# Patient Record
Sex: Female | Born: 1955 | Race: White | Hispanic: No | Marital: Married | Smoking: Never smoker
Health system: Southern US, Community
[De-identification: ages and names within clinical notes are randomized; demographics above are authoritative.]

## PROBLEM LIST (undated history)

## (undated) DIAGNOSIS — K8020 Calculus of gallbladder without cholecystitis without obstruction: Secondary | ICD-10-CM

## (undated) DIAGNOSIS — K602 Anal fissure, unspecified: Secondary | ICD-10-CM

## (undated) DIAGNOSIS — K449 Diaphragmatic hernia without obstruction or gangrene: Secondary | ICD-10-CM

## (undated) DIAGNOSIS — K635 Polyp of colon: Secondary | ICD-10-CM

## (undated) HISTORY — PX: SHOULDER SURGERY: SHX246

## (undated) HISTORY — DX: Anal fissure, unspecified: K60.2

## (undated) HISTORY — DX: Polyp of colon: K63.5

## (undated) HISTORY — DX: Diaphragmatic hernia without obstruction or gangrene: K44.9

## (undated) HISTORY — DX: Calculus of gallbladder without cholecystitis without obstruction: K80.20

## (undated) HISTORY — PX: GALLBLADDER SURGERY: SHX652

---

## 1998-05-30 ENCOUNTER — Other Ambulatory Visit: Admission: RE | Admit: 1998-05-30 | Discharge: 1998-05-30 | Payer: Self-pay | Admitting: Obstetrics and Gynecology

## 1999-06-18 ENCOUNTER — Other Ambulatory Visit: Admission: RE | Admit: 1999-06-18 | Discharge: 1999-06-18 | Payer: Self-pay | Admitting: Obstetrics and Gynecology

## 2000-07-05 ENCOUNTER — Other Ambulatory Visit: Admission: RE | Admit: 2000-07-05 | Discharge: 2000-07-05 | Payer: Self-pay | Admitting: Obstetrics and Gynecology

## 2001-07-07 ENCOUNTER — Other Ambulatory Visit: Admission: RE | Admit: 2001-07-07 | Discharge: 2001-07-07 | Payer: Self-pay | Admitting: Obstetrics and Gynecology

## 2002-07-12 ENCOUNTER — Other Ambulatory Visit: Admission: RE | Admit: 2002-07-12 | Discharge: 2002-07-12 | Payer: Self-pay | Admitting: Obstetrics and Gynecology

## 2003-08-08 ENCOUNTER — Other Ambulatory Visit: Admission: RE | Admit: 2003-08-08 | Discharge: 2003-08-08 | Payer: Self-pay | Admitting: Obstetrics and Gynecology

## 2003-08-12 ENCOUNTER — Encounter: Admission: RE | Admit: 2003-08-12 | Discharge: 2003-08-12 | Payer: Self-pay | Admitting: Obstetrics and Gynecology

## 2004-07-08 ENCOUNTER — Other Ambulatory Visit: Admission: RE | Admit: 2004-07-08 | Discharge: 2004-07-08 | Payer: Self-pay | Admitting: Obstetrics and Gynecology

## 2007-03-22 ENCOUNTER — Ambulatory Visit (HOSPITAL_BASED_OUTPATIENT_CLINIC_OR_DEPARTMENT_OTHER): Admission: RE | Admit: 2007-03-22 | Discharge: 2007-03-22 | Payer: Self-pay | Admitting: Orthopedic Surgery

## 2008-12-24 ENCOUNTER — Ambulatory Visit: Payer: Self-pay | Admitting: Sports Medicine

## 2008-12-24 DIAGNOSIS — M24876 Other specific joint derangements of unspecified foot, not elsewhere classified: Secondary | ICD-10-CM

## 2008-12-24 DIAGNOSIS — R269 Unspecified abnormalities of gait and mobility: Secondary | ICD-10-CM | POA: Insufficient documentation

## 2008-12-24 DIAGNOSIS — M2140 Flat foot [pes planus] (acquired), unspecified foot: Secondary | ICD-10-CM | POA: Insufficient documentation

## 2008-12-24 DIAGNOSIS — M24873 Other specific joint derangements of unspecified ankle, not elsewhere classified: Secondary | ICD-10-CM | POA: Insufficient documentation

## 2009-03-04 ENCOUNTER — Ambulatory Visit (HOSPITAL_COMMUNITY): Admission: RE | Admit: 2009-03-04 | Discharge: 2009-03-05 | Payer: Self-pay | Admitting: General Surgery

## 2009-03-04 ENCOUNTER — Encounter (INDEPENDENT_AMBULATORY_CARE_PROVIDER_SITE_OTHER): Payer: Self-pay | Admitting: General Surgery

## 2009-04-03 ENCOUNTER — Encounter: Admission: RE | Admit: 2009-04-03 | Discharge: 2009-04-03 | Payer: Self-pay | Admitting: Obstetrics and Gynecology

## 2010-01-08 ENCOUNTER — Emergency Department (HOSPITAL_COMMUNITY): Admission: EM | Admit: 2010-01-08 | Discharge: 2009-02-10 | Payer: Self-pay | Admitting: Emergency Medicine

## 2010-02-22 ENCOUNTER — Encounter: Payer: Self-pay | Admitting: Obstetrics and Gynecology

## 2010-02-24 ENCOUNTER — Other Ambulatory Visit: Payer: Self-pay | Admitting: Obstetrics and Gynecology

## 2010-02-24 DIAGNOSIS — Z1239 Encounter for other screening for malignant neoplasm of breast: Secondary | ICD-10-CM

## 2010-02-27 ENCOUNTER — Other Ambulatory Visit: Payer: Self-pay | Admitting: Internal Medicine

## 2010-02-27 DIAGNOSIS — E28319 Asymptomatic premature menopause: Secondary | ICD-10-CM

## 2010-03-03 NOTE — Assessment & Plan Note (Signed)
Summary: NP ANKLE INSTABILITY/MJD   Vital Signs:  Patient profile:   55 year old female Height:      67 inches Weight:      170 pounds BMI:     26.72 BP sitting:   111 / 71  Vitals Entered By: Lillia Pauls CMA (December 24, 2008 1:41 PM)  History of Present Illness: 55 yo F presents with bilateral ankle instability.  Patient reports prior history of recurrent ankle sprains, most recently 2 years ago.  Likes to hike and they feel unstable when she is stepping on rocks or on uneven terrain.  Has not done physical therapy, does not have ASOs.  Has new hiking boots that give her ankles better support.  No pain currently but this comes and goes.  Has intermittent numbness on top of right foot and pain lateral left ankle.  Physical Exam  General:  Well-developed,well-nourished,in no acute distress; alert,appropriate and cooperative throughout examination Msk:  L Ankle: No visible erythema.  Mild swelling sinus tarsi. Range of motion is full in all directions. Strength is 5/5 in all directions. 2+ ant drawer, 1+ talar tilt, neg reverse talar tilt. Talar dome nontender; No pain at base of 5th MT; No tenderness over cuboid; No tenderness over N spot or navicular prominence No tenderness on posterior aspects of lateral and medial malleolus No sign of peroneal tendon subluxations; Negative tarsal tunnel tinel's Able to walk 4 steps.   R Ankle: No visible erythema.  Minimal sinus tarsi swelling. Range of motion is full in all directions. Strength is 5/5 in all directions. 2+ anterior drawer, 1+ talar tilt, neg reverse talar tilt Talar dome nontender; No pain at base of 5th MT; No tenderness over cuboid; No tenderness over N spot or navicular prominence No tenderness on posterior aspects of lateral and medial malleolus No sign of peroneal tendon subluxations; Negative tarsal tunnel tinel's Able to walk 4 steps.  Pes planus on left, less overpronation on right but still  present Outturns bilateral feet with ambulation - improved with athletic insoles with scaphoid pads Less PT firing on left on toe raises. No leg length inequality   Impression & Recommendations:  Problem # 1:  ANKLE INSTABILITY (EAV-409.81) Assessment New  Ankle exercises given with theraband.  To do balance exercises as well - handout provided.  Consider ASO bilaterally if still has instability with exercises and correction of foot breakdown/gait abnormality.  Orders: Sports Insoles [phone])  Problem # 2:  ABNORMALITY OF GAIT (ICD-781.2) Assessment: New Improved with sports insoles with scaphoid pads.  Orders: Sports Insoles [phone])  Problem # 3:  PES PLANUS (ICD-734) Assessment: New Scaphoid pads bilaterally.  Orders: Sports Insoles [phone])  Patient Instructions: 1)  Wear inserts with scaphoid pads in your shoes - you can move these from shoe to shoe. 2)  Do balance exercises on handout and theraband strengthening exercises. 3)  Follow up with [place] in 1 month for reevaluation.

## 2010-03-16 ENCOUNTER — Ambulatory Visit
Admission: RE | Admit: 2010-03-16 | Discharge: 2010-03-16 | Disposition: A | Discharge status: Home / Self Care | Payer: BC Managed Care – PPO | Source: Ambulatory Visit | Attending: Internal Medicine | Admitting: Internal Medicine

## 2010-03-16 ENCOUNTER — Other Ambulatory Visit: Payer: Self-pay | Admitting: Internal Medicine

## 2010-03-16 DIAGNOSIS — E28319 Asymptomatic premature menopause: Secondary | ICD-10-CM

## 2010-04-07 ENCOUNTER — Ambulatory Visit: Payer: Self-pay

## 2010-04-19 LAB — CBC
HCT: 38.8 % (ref 36.0–46.0)
HCT: 40.2 % (ref 36.0–46.0)
Hemoglobin: 13.5 g/dL (ref 12.0–15.0)
Hemoglobin: 13.6 g/dL (ref 12.0–15.0)
MCHC: 34.7 g/dL (ref 30.0–36.0)
MCHC: 33.7 g/dL (ref 30.0–36.0)
MCV: 90.3 fL (ref 78.0–100.0)
MCV: 89.9 fL (ref 78.0–100.0)
Platelets: 221 10*3/uL (ref 150–400)
Platelets: 275 10*3/uL (ref 150–400)
RBC: 4.3 MIL/uL (ref 3.87–5.11)
RBC: 4.48 MIL/uL (ref 3.87–5.11)
RDW: 13.1 % (ref 11.5–15.5)
RDW: 12.8 % (ref 11.5–15.5)
WBC: 8.5 10*3/uL (ref 4.0–10.5)
WBC: 4.5 10*3/uL (ref 4.0–10.5)

## 2010-04-19 LAB — COMPREHENSIVE METABOLIC PANEL
ALT: 20 U/L (ref 0–35)
ALT: 17 U/L (ref 0–35)
AST: 30 U/L (ref 0–37)
AST: 23 U/L (ref 0–37)
Albumin: 4.2 g/dL (ref 3.5–5.2)
Albumin: 4.2 g/dL (ref 3.5–5.2)
Alkaline Phosphatase: 109 U/L (ref 39–117)
Alkaline Phosphatase: 116 U/L (ref 39–117)
BUN: 13 mg/dL (ref 6–23)
BUN: 7 mg/dL (ref 6–23)
CO2: 21 mEq/L (ref 19–32)
CO2: 28 mEq/L (ref 19–32)
Calcium: 9.6 mg/dL (ref 8.4–10.5)
Calcium: 9.5 mg/dL (ref 8.4–10.5)
Chloride: 102 mEq/L (ref 96–112)
Chloride: 105 mEq/L (ref 96–112)
Creatinine, Ser: 0.82 mg/dL (ref 0.4–1.2)
Creatinine, Ser: 0.82 mg/dL (ref 0.4–1.2)
GFR calc Af Amer: >60 mL/min (ref >60)
GFR calc Af Amer: >60 mL/min (ref >60)
GFR calc non Af Amer: >60 mL/min (ref >60)
GFR calc non Af Amer: >60 mL/min (ref >60)
Glucose, Bld: 162 mg/dL — ABNORMAL HIGH (ref 70–99)
Glucose, Bld: 105 mg/dL — ABNORMAL HIGH (ref 70–99)
Potassium: 3.4 mEq/L — ABNORMAL LOW (ref 3.5–5.1)
Potassium: 4.3 mEq/L (ref 3.5–5.1)
Sodium: 138 mEq/L (ref 135–145)
Sodium: 140 mEq/L (ref 135–145)
Total Bilirubin: 0.4 mg/dL (ref 0.3–1.2)
Total Bilirubin: 0.3 mg/dL (ref 0.3–1.2)
Total Protein: 7.5 g/dL (ref 6.0–8.3)
Total Protein: 6.9 g/dL (ref 6.0–8.3)

## 2010-04-19 LAB — DIFFERENTIAL
Basophils Absolute: 0 10*3/uL (ref 0.0–0.1)
Basophils Absolute: 0 10*3/uL (ref 0.0–0.1)
Basophils Relative: 1 % (ref 0–1)
Basophils Relative: 1 % (ref 0–1)
Eosinophils Absolute: 0.1 10*3/uL (ref 0.0–0.7)
Eosinophils Absolute: 0.1 10*3/uL (ref 0.0–0.7)
Eosinophils Relative: 1 % (ref 0–5)
Eosinophils Relative: 2 % (ref 0–5)
Lymphocytes Relative: 17 % (ref 12–46)
Lymphocytes Relative: 38 % (ref 12–46)
Lymphs Abs: 1.4 10*3/uL (ref 0.7–4.0)
Lymphs Abs: 1.7 10*3/uL (ref 0.7–4.0)
Monocytes Absolute: 0.7 10*3/uL (ref 0.1–1.0)
Monocytes Absolute: 0.4 10*3/uL (ref 0.1–1.0)
Monocytes Relative: 8 % (ref 3–12)
Monocytes Relative: 9 % (ref 3–12)
Neutro Abs: 6.3 10*3/uL (ref 1.7–7.7)
Neutro Abs: 2.3 10*3/uL (ref 1.7–7.7)
Neutrophils Relative %: 74 % (ref 43–77)
Neutrophils Relative %: 50 % (ref 43–77)

## 2010-04-19 LAB — URINALYSIS, ROUTINE W REFLEX MICROSCOPIC
Bilirubin Urine: NEGATIVE
Glucose, UA: NEGATIVE mg/dL
Hgb urine dipstick: NEGATIVE
Ketones, ur: >80 mg/dL — AB
Nitrite: NEGATIVE
Protein, ur: NEGATIVE mg/dL
Specific Gravity, Urine: 1.024 (ref 1.005–1.030)
Urobilinogen, UA: 0.2 mg/dL (ref 0.0–1.0)
pH: 8.5 — ABNORMAL HIGH (ref 5.0–8.0)

## 2010-04-19 LAB — POCT CARDIAC MARKERS
CKMB, poc: <1 ng/mL — ABNORMAL LOW (ref 1.0–8.0)
Myoglobin, poc: 52.4 ng/mL (ref 12–200)
Troponin i, poc: <0.05 ng/mL (ref 0.00–0.09)

## 2010-04-19 LAB — LIPASE, BLOOD: Lipase: 32 U/L (ref 11–59)

## 2010-04-19 LAB — LACTIC ACID, PLASMA: Lactic Acid, Venous: 4.8 mmol/L — ABNORMAL HIGH (ref 0.5–2.2)

## 2010-04-21 ENCOUNTER — Ambulatory Visit
Admission: RE | Admit: 2010-04-21 | Discharge: 2010-04-21 | Disposition: A | Discharge status: Home / Self Care | Payer: BC Managed Care – PPO | Source: Ambulatory Visit | Attending: Obstetrics and Gynecology | Admitting: Obstetrics and Gynecology

## 2010-04-21 DIAGNOSIS — Z1239 Encounter for other screening for malignant neoplasm of breast: Secondary | ICD-10-CM

## 2010-04-22 LAB — CARDIAC PANEL(CRET KIN+CKTOT+MB+TROPI)
CK, MB: 0.7 ng/mL (ref 0.3–4.0)
CK, MB: 0.8 ng/mL (ref 0.3–4.0)
Relative Index: INVALID (ref 0.0–2.5)
Relative Index: INVALID (ref 0.0–2.5)
Total CK: 41 U/L (ref 7–177)
Total CK: 43 U/L (ref 7–177)
Troponin I: 0.01 ng/mL (ref 0.00–0.06)
Troponin I: 0.01 ng/mL (ref 0.00–0.06)

## 2010-06-16 NOTE — Op Note (Signed)
NAME:  Lacey Richardson, CHISM NO.:  [id number]   MEDICAL RECORD NO.:  [id number]          PATIENT TYPE:  AMB   LOCATION:  DSC                          FACILITY:  [facility]   PHYSICIAN:  Mila Homer. Sherlean Foot, M.D. DATE OF BIRTH:  Jul 22, 1955   DATE OF PROCEDURE:  03/22/2007  DATE OF DISCHARGE:                               OPERATIVE REPORT   SURGEON:  Mila Homer. Sherlean Foot, M.D.   ASSISTANTArlys John D. Petrarca, P.A.-C.   ANESTHESIA:  General.   PREOPERATIVE DIAGNOSIS:  Right shoulder labral tear.   POSTOPERATIVE DIAGNOSIS:  Right shoulder labral tear.   PROCEDURE:  Right shoulder arthroscopy and labral repair.   INDICATIONS FOR PROCEDURE:  The patient is 55 years old status post  subluxation episode now with arthrofibrosis of the shoulder and evidence  of a labral tear.  Informed consent was obtained.   DESCRIPTION OF PROCEDURE:  The patient was laid supine and administered  general anesthesia.  The shoulder was prepped and draped in the usual  sterile fashion with the patient in a beach chair position.  Anterior  posterior portals were created with a #11 blade, blunt trocar and  cannula, diagnostic arthroscopy revealed no chondromalacia in the joint,  a labral tear anteriorly, appeared to have healed proximally at the  biceps to about the 3 o'clock position, lots of inflammation in the  shoulder, the inflammation was debrided. This was from the manipulation  obviously.  I then evaluated the labrum and felt that a single anchor  repair would be adequate.  I then used a 4 mm cylindrical bur to bur the  anterior glenoid and then used the 3.5 push lock system to place a  single push lock repairing the labrum.  This provided a really nice  repair of the labrum and there was a negative drive through sign. The  rest of the shoulder looked fine except for the inflammation. Closed  with 4-0 nylon sutures, dressed with Xeroform dressing, sponges, ABDs  and 2 inch silk tape, and a sling  and swath.  Complications none.  Drains none.           ______________________________  Mila Homer. Sherlean Foot, M.D.     SDL/MEDQ  D:  03/22/2007  T:  03/22/2007  Job:  [id number]

## 2010-09-01 ENCOUNTER — Other Ambulatory Visit: Payer: Self-pay | Admitting: Family Medicine

## 2010-09-01 ENCOUNTER — Other Ambulatory Visit (HOSPITAL_COMMUNITY)
Admission: RE | Admit: 2010-09-01 | Discharge: 2010-09-01 | Disposition: A | Discharge status: Home / Self Care | Payer: BC Managed Care – PPO | Source: Ambulatory Visit | Attending: Family Medicine | Admitting: Family Medicine

## 2010-09-01 DIAGNOSIS — Z1159 Encounter for screening for other viral diseases: Secondary | ICD-10-CM | POA: Insufficient documentation

## 2010-09-01 DIAGNOSIS — Z124 Encounter for screening for malignant neoplasm of cervix: Secondary | ICD-10-CM | POA: Insufficient documentation

## 2010-10-23 LAB — POCT HEMOGLOBIN-HEMACUE: Hemoglobin: 13.7

## 2011-03-16 ENCOUNTER — Other Ambulatory Visit: Payer: Self-pay | Admitting: Family Medicine

## 2011-03-16 DIAGNOSIS — Z1231 Encounter for screening mammogram for malignant neoplasm of breast: Secondary | ICD-10-CM

## 2011-04-22 ENCOUNTER — Ambulatory Visit: Payer: BC Managed Care – PPO

## 2011-05-21 ENCOUNTER — Ambulatory Visit
Admission: RE | Admit: 2011-05-21 | Discharge: 2011-05-21 | Disposition: A | Discharge status: Home / Self Care | Payer: BC Managed Care – PPO | Source: Ambulatory Visit | Attending: Family Medicine | Admitting: Family Medicine

## 2011-05-21 DIAGNOSIS — Z1231 Encounter for screening mammogram for malignant neoplasm of breast: Secondary | ICD-10-CM

## 2011-06-09 IMAGING — US US ABDOMEN COMPLETE
1 series · 14 of 25 positions shown · non-contrast
Comparison: None

CLINICAL DATA: Epigastric pain.

COMPLETE ABDOMINAL ULTRASOUND

[Series 1: us abdomen complete · 0.33mm/px · 14 of 75 slices shown]
[im 1/75]
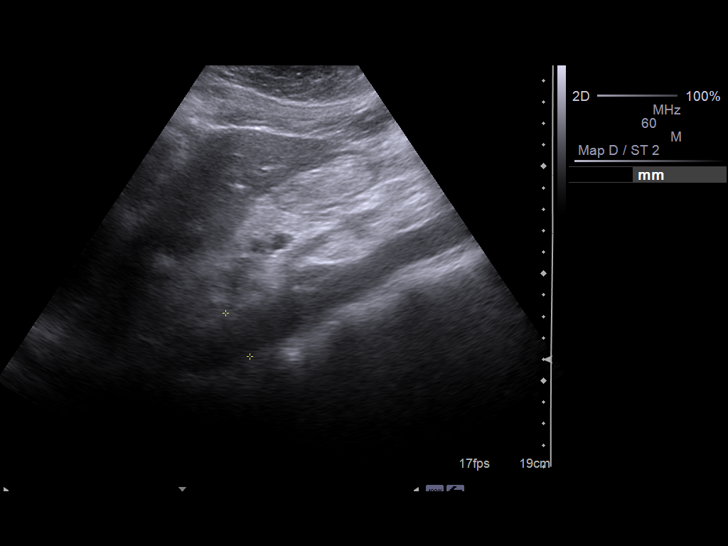
[im 7/75]
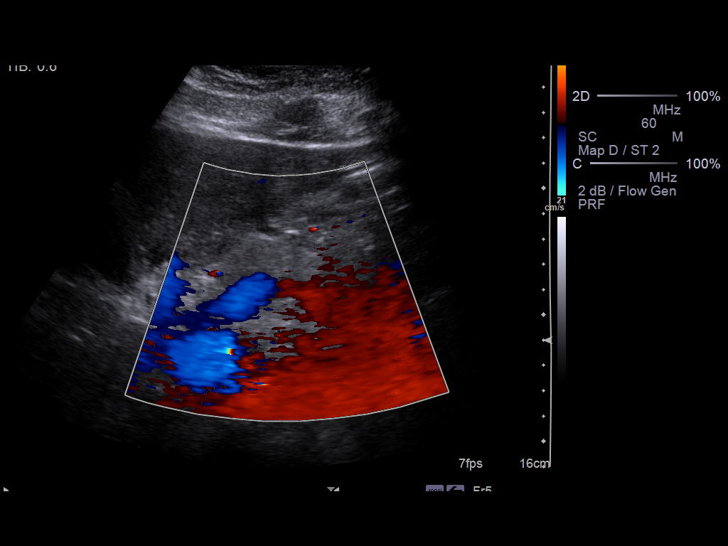
[im 13/75]
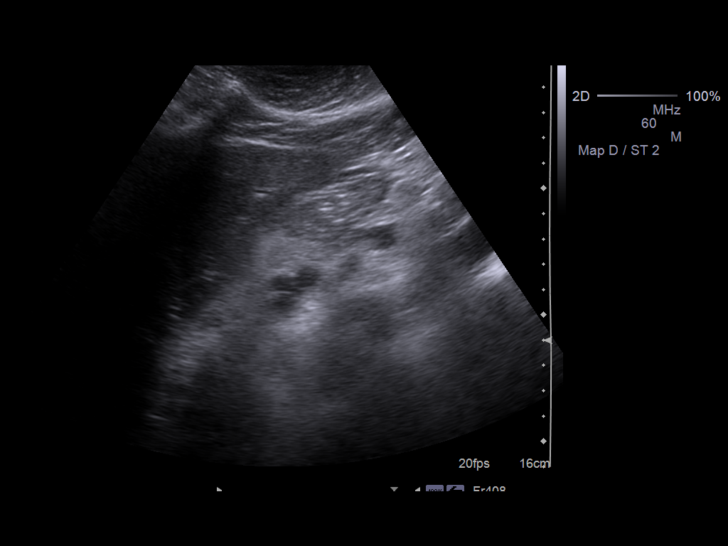
[im 19/75]
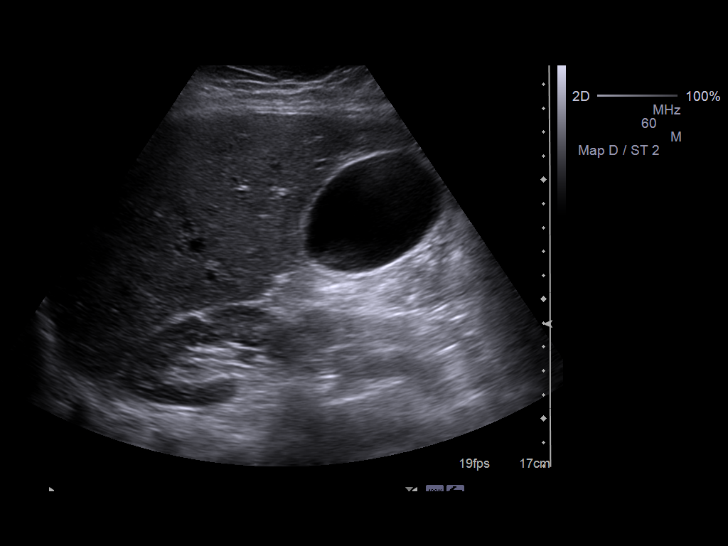
[im 25/75]
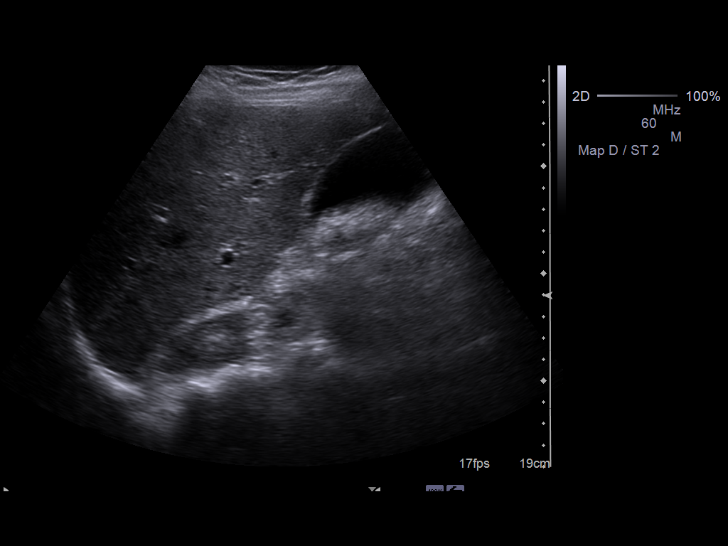
[im 28/75]
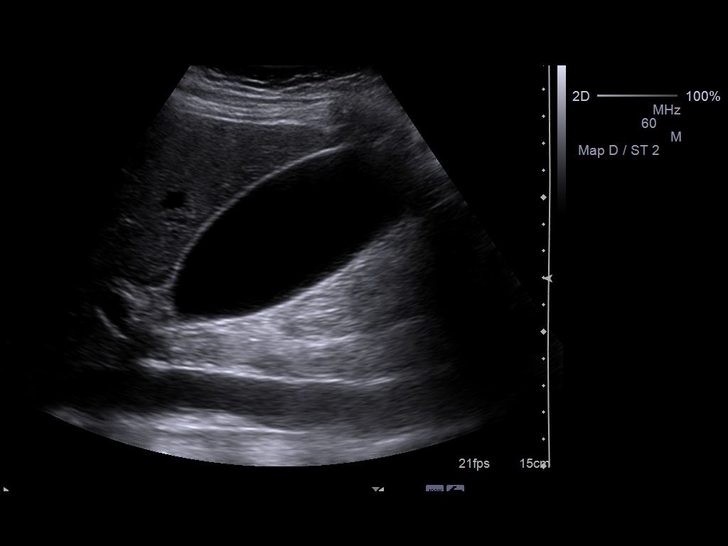
[im 34/75]
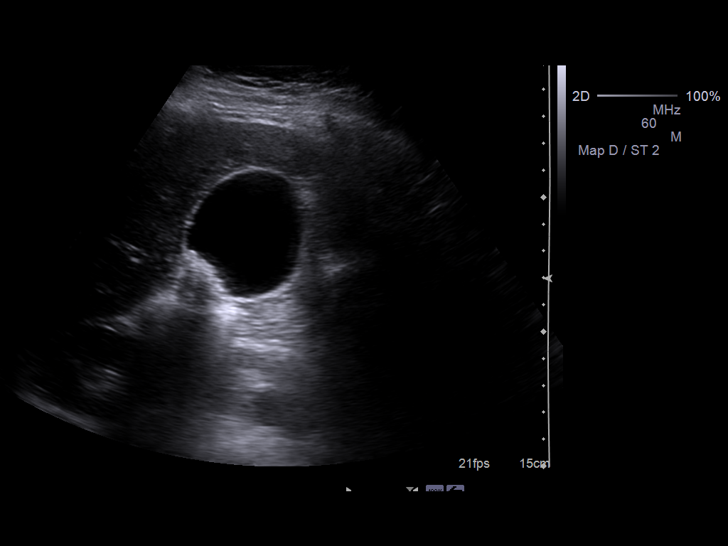
[im 41/75]
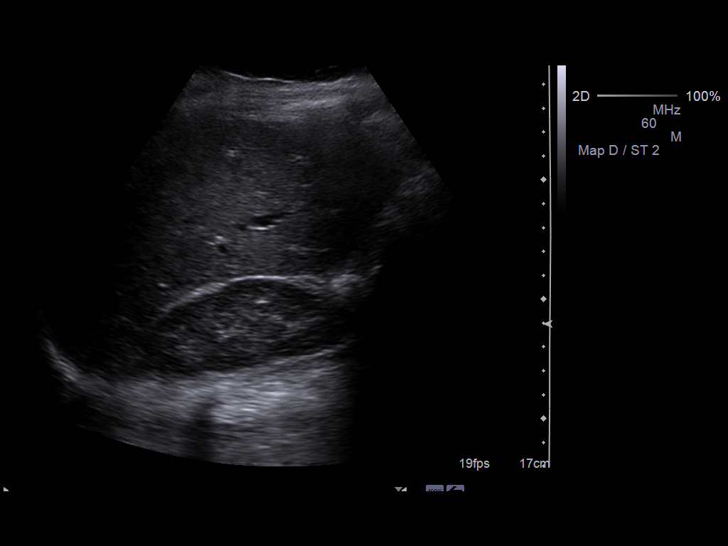
[im 47/75]
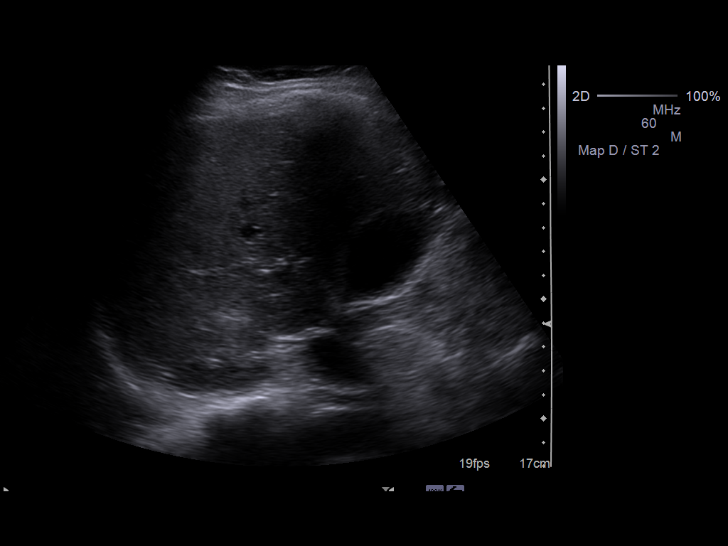
[im 50/75]
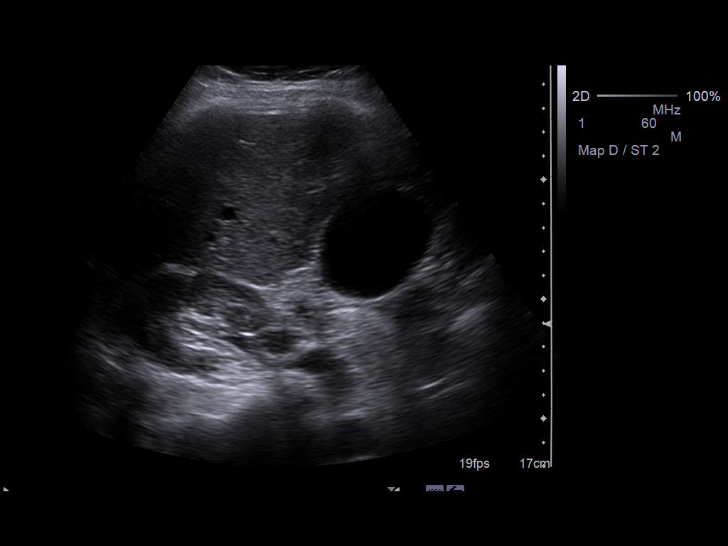
[im 56/75]
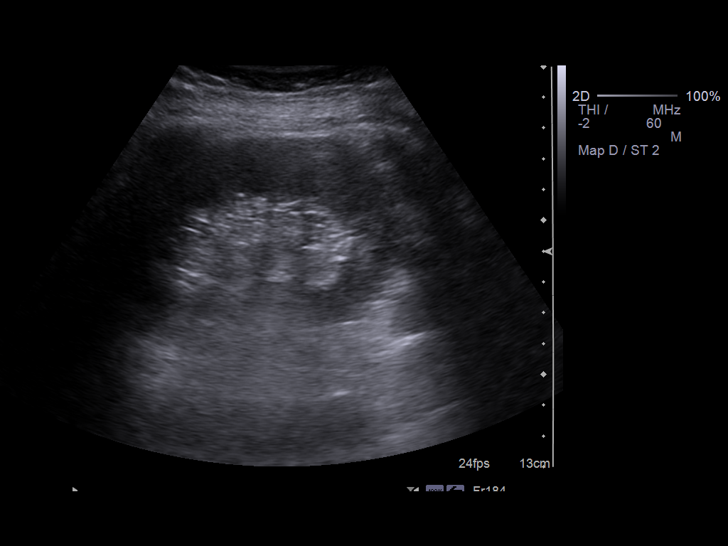
[im 62/75]
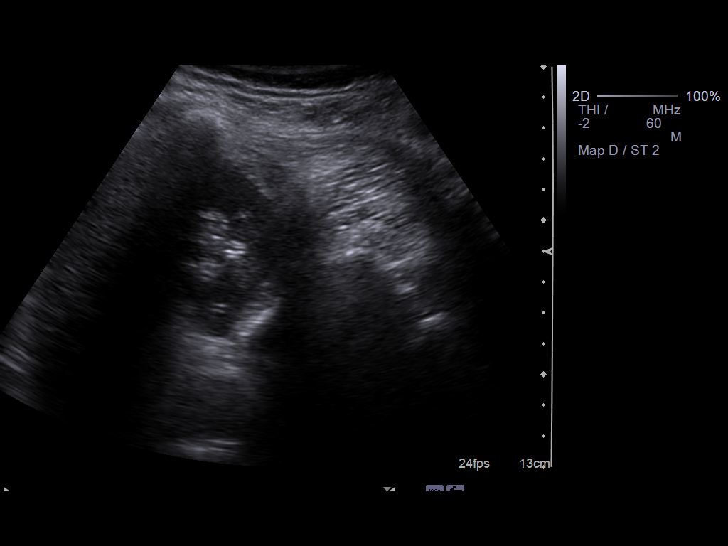
[im 68/75]
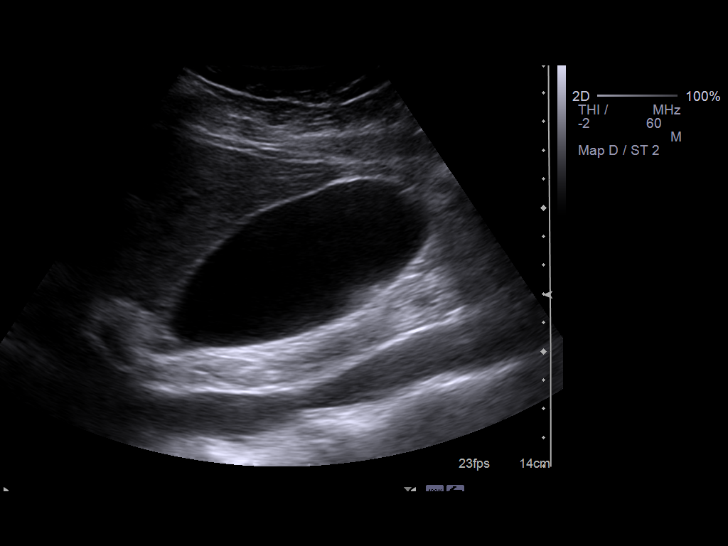
[im 75/75]
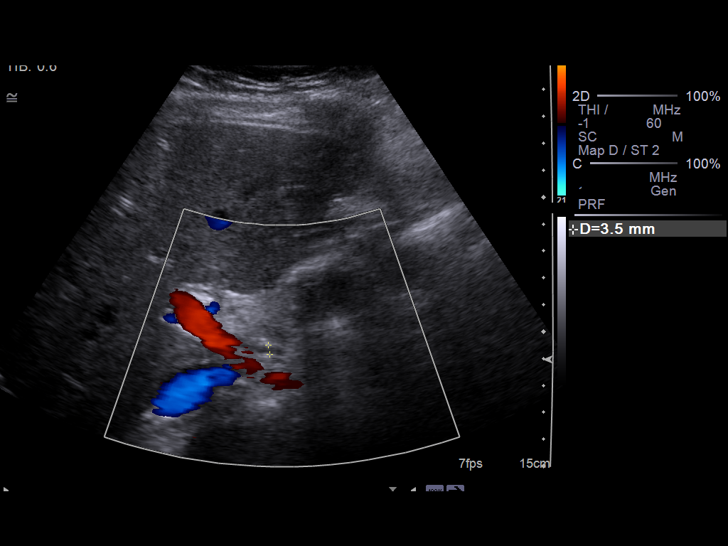

[14 of 25 positions shown; findings below may reference images not displayed]

FINDINGS: Gallbladder:  There are layering stones noted within the
gallbladder.  Gallbladder is mildly distended.  Negative
sonographic [place].  No wall thickening.

Common bile duct:   Within normal limits in caliber.

Liver:  No focal lesion identified.  Within normal limits in
parenchymal echogenicity.

IVC:  Appears normal.

Pancreas:  Visualized portions unremarkable.  Body and tail are
obscured by bowel gas.

Spleen:  Within normal limits in size and echotexture.

Right Kidney:   Normal in size and parenchymal echogenicity.  No
evidence of mass or hydronephrosis.

Left Kidney:  Normal in size and parenchymal echogenicity.  No
evidence of mass or hydronephrosis.

Abdominal aorta:  No aneurysm identified.
IMPRESSION: Cholelithiasis.  Gallbladder is mildly distended.  No sonographic
evidence of acute cholecystitis.

## 2012-05-01 ENCOUNTER — Other Ambulatory Visit: Payer: Self-pay

## 2012-05-01 DIAGNOSIS — Z1231 Encounter for screening mammogram for malignant neoplasm of breast: Secondary | ICD-10-CM

## 2012-06-09 ENCOUNTER — Ambulatory Visit
Admission: RE | Admit: 2012-06-09 | Discharge: 2012-06-09 | Disposition: A | Discharge status: Home / Self Care | Payer: BC Managed Care – PPO | Source: Ambulatory Visit

## 2012-06-09 DIAGNOSIS — Z1231 Encounter for screening mammogram for malignant neoplasm of breast: Secondary | ICD-10-CM

## 2013-05-09 ENCOUNTER — Other Ambulatory Visit: Payer: Self-pay

## 2013-05-09 DIAGNOSIS — Z1231 Encounter for screening mammogram for malignant neoplasm of breast: Secondary | ICD-10-CM

## 2013-06-11 ENCOUNTER — Ambulatory Visit
Admission: RE | Admit: 2013-06-11 | Discharge: 2013-06-11 | Disposition: A | Discharge status: Home / Self Care | Payer: BC Managed Care – PPO | Source: Ambulatory Visit

## 2013-06-11 DIAGNOSIS — Z1231 Encounter for screening mammogram for malignant neoplasm of breast: Secondary | ICD-10-CM

## 2013-07-30 ENCOUNTER — Other Ambulatory Visit: Payer: Self-pay | Admitting: Dermatology

## 2013-08-14 ENCOUNTER — Other Ambulatory Visit: Payer: Self-pay | Admitting: Dermatology

## 2014-05-15 ENCOUNTER — Other Ambulatory Visit: Payer: Self-pay

## 2014-05-15 DIAGNOSIS — Z1231 Encounter for screening mammogram for malignant neoplasm of breast: Secondary | ICD-10-CM

## 2014-06-19 ENCOUNTER — Ambulatory Visit
Admission: RE | Admit: 2014-06-19 | Discharge: 2014-06-19 | Disposition: A | Discharge status: Home / Self Care | Payer: 59 | Source: Ambulatory Visit

## 2014-06-19 DIAGNOSIS — Z1231 Encounter for screening mammogram for malignant neoplasm of breast: Secondary | ICD-10-CM

## 2014-12-24 ENCOUNTER — Other Ambulatory Visit (HOSPITAL_COMMUNITY)
Admission: RE | Admit: 2014-12-24 | Discharge: 2014-12-24 | Disposition: A | Discharge status: Home / Self Care | Payer: 59 | Source: Ambulatory Visit | Attending: Family Medicine | Admitting: Family Medicine

## 2014-12-24 ENCOUNTER — Other Ambulatory Visit: Payer: Self-pay | Admitting: Family Medicine

## 2014-12-24 DIAGNOSIS — Z124 Encounter for screening for malignant neoplasm of cervix: Secondary | ICD-10-CM | POA: Insufficient documentation

## 2014-12-30 LAB — CYTOLOGY - PAP

## 2015-05-15 ENCOUNTER — Other Ambulatory Visit: Payer: Self-pay

## 2015-05-15 DIAGNOSIS — Z1231 Encounter for screening mammogram for malignant neoplasm of breast: Secondary | ICD-10-CM

## 2015-06-20 ENCOUNTER — Ambulatory Visit
Admission: RE | Admit: 2015-06-20 | Discharge: 2015-06-20 | Disposition: A | Discharge status: Home / Self Care | Payer: Managed Care, Other (non HMO) | Source: Ambulatory Visit

## 2015-06-20 DIAGNOSIS — Z1231 Encounter for screening mammogram for malignant neoplasm of breast: Secondary | ICD-10-CM

## 2016-01-01 ENCOUNTER — Other Ambulatory Visit (HOSPITAL_COMMUNITY)
Admission: RE | Admit: 2016-01-01 | Discharge: 2016-01-01 | Disposition: A | Discharge status: Home / Self Care | Payer: Managed Care, Other (non HMO) | Source: Ambulatory Visit | Attending: Family Medicine | Admitting: Family Medicine

## 2016-01-01 ENCOUNTER — Other Ambulatory Visit: Payer: Self-pay | Admitting: Family Medicine

## 2016-01-01 DIAGNOSIS — Z124 Encounter for screening for malignant neoplasm of cervix: Secondary | ICD-10-CM | POA: Diagnosis present

## 2016-01-05 LAB — CYTOLOGY - PAP: Diagnosis: NEGATIVE

## 2016-05-11 ENCOUNTER — Other Ambulatory Visit: Payer: Self-pay | Admitting: Family Medicine

## 2016-05-11 DIAGNOSIS — Z1231 Encounter for screening mammogram for malignant neoplasm of breast: Secondary | ICD-10-CM

## 2016-06-21 ENCOUNTER — Ambulatory Visit
Admission: RE | Admit: 2016-06-21 | Discharge: 2016-06-21 | Disposition: A | Discharge status: Home / Self Care | Payer: Managed Care, Other (non HMO) | Source: Ambulatory Visit | Attending: Family Medicine | Admitting: Family Medicine

## 2016-06-21 DIAGNOSIS — Z1231 Encounter for screening mammogram for malignant neoplasm of breast: Secondary | ICD-10-CM

## 2017-04-28 ENCOUNTER — Institutional Professional Consult (permissible substitution): Payer: Managed Care, Other (non HMO) | Admitting: Pulmonary Disease

## 2017-05-17 ENCOUNTER — Other Ambulatory Visit: Payer: Self-pay | Admitting: Family Medicine

## 2017-05-17 DIAGNOSIS — Z139 Encounter for screening, unspecified: Secondary | ICD-10-CM

## 2017-06-13 ENCOUNTER — Encounter: Payer: Self-pay | Admitting: Pulmonary Disease

## 2017-06-13 ENCOUNTER — Ambulatory Visit: Payer: 59 | Admitting: Pulmonary Disease

## 2017-06-13 DIAGNOSIS — G4700 Insomnia, unspecified: Secondary | ICD-10-CM | POA: Insufficient documentation

## 2017-06-13 DIAGNOSIS — F5104 Psychophysiologic insomnia: Secondary | ICD-10-CM

## 2017-06-13 DIAGNOSIS — K449 Diaphragmatic hernia without obstruction or gangrene: Secondary | ICD-10-CM | POA: Diagnosis not present

## 2017-06-13 NOTE — Patient Instructions (Signed)
Schedule CT chest and abdomen without contrast -for sliding hernia.  For insomnia, trial of -REM fresh (melatonin) If this does not work, then can trial - Ambien  5 mg  OR -trazodone 50 mg  Rules of sleep hygiene were discussed  - light exercise -avoid caffeinated beverages - no more than 20 mins staying awake in bed, if not asleep, get out of bed & reading or light music - No TV or computer games/ screen at/after bedtime.  Consider referral to insomnia sleep therapist if symptoms persist

## 2017-06-13 NOTE — Assessment & Plan Note (Signed)
For insomnia, trial of -REM fresh (melatonin) If this does not work, then can trial - Ambien  5 mg  OR -trazodone 50 mg  Rules of sleep hygiene were discussed  - light exercise -avoid caffeinated beverages - no more than 20 mins staying awake in bed, if not asleep, get out of bed & reading or light music - No TV or computer games/ screen at/after bedtime.  Consider referral to insomnia sleep therapist if symptoms persist

## 2017-06-13 NOTE — Assessment & Plan Note (Addendum)
No clear reason identified for this noise from her epigastrium/chest.  Again audio recording seems to reveal a more pulsatile/rhythmic low pitched sound rather than gurgling that I would expect with hiatal hernia.  I reassured her that the benign nature of her complaint  Schedule CT chest and abdomen without contrast -for sliding hernia.  Consider esophagram if significant

## 2017-06-13 NOTE — Progress Notes (Signed)
Subjective:    Patient ID: Lacey Richardson, female    DOB: 23-Mar-1955, 62 y.o.   MRN: [id number]  HPI  62 year old woman was referred for evaluation of hiatal hernia and gurgling sound in her chest. She reports that this is ongoing for about 2 years and she has reported to her PCP.  No clear trigger or relieving factors.  She points to epigastric region from where it seems to emanate and she has a recording which she obtained by keeping her phone close to her breastbone and forceful expirations.  Audio recording seems to reveal a low pitch pulsating and rhythmic type of sound rather than gurgling sound of bowels or stomach.  This is not associated with chest or abdominal pain or dyspnea or wheezing or frequent chest colds.  She does not have symptoms of heartburn or indigestion.  She would like to get this checked out for reassurance.  She also reports insomnia of sleep onset and sleep maintenance going on for many years.  This started around her postmenopausal.  And has persisted.  She has seen Dr. Elenore Rota for this in the past.  She tried over-the-counter melatonin but this did not seem to help much .  Ambien was mentioned but never consistently tried.  She was prescribed gabapentin and she has been using this without significant benefit for the past year.  She denies excessive caffeinated beverages.  She does get out of bed and read her Kindle when she is not able to fall asleep.  She denies daytime naps.  No snoring or apneas have been witnessed by her husband.  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 No pertinent past medical history.  History reviewed. No pertinent surgical history.   No Known Allergies  Social History   Socioeconomic History  . Marital status: Married    Spouse name: Not on  file  . Number of children: Not on file  . Years of education: Not on file  . Highest education level: Not on file  Occupational History  . Not on file  Social Needs  . Financial resource strain: Not on file  . Food insecurity:    Worry: Not on file    Inability: Not on file  . Transportation needs:    Medical: Not on file    Non-medical: Not on file  Tobacco Use  . Smoking status: Never Smoker  . Smokeless tobacco: Never Used  Substance and Sexual Activity  . Alcohol use: Not on file  . Drug use: Not on file  . Sexual activity: Not on file  Lifestyle  . Physical activity:    Days per week: Not on file    Minutes per session: Not on file  . Stress: Not on file  Relationships  . Social connections:    Talks on phone: Not on file    Gets together: Not on file    Attends religious service: Not on file    Active member of club or organization: Not on file    Attends meetings of clubs or organizations: Not on file    Relationship status: Not on file  . Intimate partner violence:    Fear of current or ex partner: Not on file    Emotionally abused: Not on file    Physically abused: Not on file  Forced sexual activity: Not on file  Other Topics Concern  . Not on file  Social History Narrative  . Not on file      Family History  Problem Relation Age of Onset  . Breast cancer Mother      Review of Systems Constitutional: negative for anorexia, fevers and sweats  Eyes: negative for irritation, redness and visual disturbance  Ears, nose, mouth, throat, and face: negative for earaches, epistaxis, nasal congestion and sore throat  Respiratory: negative for cough, dyspnea on exertion, sputum and wheezing  Cardiovascular: negative for chest pain, dyspnea, lower extremity edema, orthopnea, palpitations and syncope  Gastrointestinal: negative for abdominal pain, constipation, diarrhea, melena, nausea and vomiting  Genitourinary:negative for dysuria, frequency and hematuria   Hematologic/lymphatic: negative for bleeding, easy bruising and lymphadenopathy  Musculoskeletal:negative for arthralgias, muscle weakness and stiff joints  Neurological: negative for coordination problems, gait problems, headaches and weakness  Endocrine: negative for diabetic symptoms including polydipsia, polyuria and weight loss     Objective:   Physical Exam  Gen. Pleasant, well-nourished, in no distress ENT - no thrush, no post nasal drip Neck: No JVD, no thyromegaly, no carotid bruits Lungs: no use of accessory muscles, no dullness to percussion, clear without rales or rhonchi  Cardiovascular: Rhythm regular, heart sounds  normal, no murmurs or gallops, no peripheral edema Musculoskeletal: No deformities, no cyanosis or clubbing        Assessment & Plan:

## 2017-06-15 ENCOUNTER — Telehealth: Payer: Self-pay | Admitting: Pulmonary Disease

## 2017-06-15 NOTE — Telephone Encounter (Signed)
PCC's I don't see where this has been scheduled yet, can we send to [place]?

## 2017-06-15 NOTE — Telephone Encounter (Signed)
These CTs have been rescheduled at [place] per [place] request for 06/21/2017 @3 :30pm arrival time Patient is aware of appt and location

## 2017-06-16 ENCOUNTER — Inpatient Hospital Stay: Admission: RE | Admit: 2017-06-16 | Payer: 59 | Source: Ambulatory Visit

## 2017-06-16 ENCOUNTER — Other Ambulatory Visit: Payer: 59

## 2017-06-21 ENCOUNTER — Ambulatory Visit
Admission: RE | Admit: 2017-06-21 | Discharge: 2017-06-21 | Disposition: A | Discharge status: Home / Self Care | Payer: 59 | Source: Ambulatory Visit | Attending: Pulmonary Disease | Admitting: Pulmonary Disease

## 2017-06-21 DIAGNOSIS — K449 Diaphragmatic hernia without obstruction or gangrene: Secondary | ICD-10-CM

## 2017-06-22 ENCOUNTER — Ambulatory Visit: Payer: 59

## 2017-06-22 ENCOUNTER — Encounter: Payer: Self-pay | Admitting: Internal Medicine

## 2017-06-22 ENCOUNTER — Other Ambulatory Visit: Payer: Self-pay | Admitting: Pulmonary Disease

## 2017-06-22 DIAGNOSIS — K869 Disease of pancreas, unspecified: Secondary | ICD-10-CM

## 2017-06-22 DIAGNOSIS — K449 Diaphragmatic hernia without obstruction or gangrene: Secondary | ICD-10-CM

## 2017-06-24 ENCOUNTER — Ambulatory Visit: Payer: 59

## 2017-06-24 ENCOUNTER — Encounter: Payer: Self-pay | Admitting: Gastroenterology

## 2017-06-24 ENCOUNTER — Ambulatory Visit: Payer: 59 | Admitting: Gastroenterology

## 2017-06-24 VITALS — BP 100/60 | HR 76 | Ht 66.54 in | Wt 151.0 lb

## 2017-06-24 DIAGNOSIS — K449 Diaphragmatic hernia without obstruction or gangrene: Secondary | ICD-10-CM | POA: Diagnosis not present

## 2017-06-24 DIAGNOSIS — R933 Abnormal findings on diagnostic imaging of other parts of digestive tract: Secondary | ICD-10-CM

## 2017-06-24 NOTE — Patient Instructions (Signed)
If you are age 62 or older, your body mass index should be between 23-30. Your Body mass index is 23.98 kg/m. If this is out of the aforementioned range listed, please consider follow up with your Primary Care Provider.  If you are age 31 or younger, your body mass index should be between 19-25. Your Body mass index is 23.98 kg/m. If this is out of the aformentioned range listed, please consider follow up with your Primary Care Provider.   Follow up as needed.  It was a pleasure to meet you today!  Dr. Loletha Carrow

## 2017-06-24 NOTE — Progress Notes (Signed)
[facility] Consult Note:  History: Lacey Richardson 06/24/2017  Referring physician: Kara Mead, MD  Reason for consult/chief complaint: abnormal CT (Hiatal henria and enlarged pancreas)   Subjective  HPI:  This is a very pleasant 62 year old woman not previously seen by this practice.  She has had her routine colonoscopy care by Dr. Earlean Shawl, and she recalls that that was probably 5 years ago. She was referred by [facility] of pulmonary clinic for findings on imaging studies.  She had been telling her primary care provider for at least a year about some noises she would here in the lower chest/upper abdomen.  She saw Dr.Alva for this, and a chest CT was performed that showed a large hiatal hernia.  The radiologist also made a nonspecific comment about a bulbous appearance of the pancreatic head, and then she was referred for our opinion. Zigmund Daniel rarely gets heartburn or regurgitation, she denies dysphagia, odynophagia, nausea, vomiting, anorexia or weight loss.  Her bowel habits are regular without rectal bleeding.  She has no nocturnal chest pain, pressure, dyspnea or heartburn.   ROS:  Review of Systems  Constitutional: Negative for appetite change and unexpected weight change.  HENT: Negative for mouth sores and voice change.   Eyes: Negative for pain and redness.  Respiratory: Negative for cough and shortness of breath.   Cardiovascular: Negative for chest pain and palpitations.  Genitourinary: Negative for dysuria and hematuria.  Musculoskeletal: Negative for arthralgias and myalgias.  Skin: Negative for pallor and rash.  Neurological: Negative for weakness and headaches.  Hematological: Negative for adenopathy.     Past Medical History: Past Medical History:  Diagnosis Date  . Anal fissure   . Colon polyp   . Gallstones   . Hiatal hernia      Past Surgical History: Past Surgical History:  Procedure Laterality Date  . GALLBLADDER SURGERY    .  SHOULDER SURGERY Right      Family History: Family History  Problem Relation Age of Onset  . Ovarian cancer Mother        mets  . Diabetes Father   . Rheum arthritis Maternal Grandmother     Social History: Social History   Socioeconomic History  . Marital status: Married    Spouse name: Not on file  . Number of children: 3  . Years of education: Not on file  . Highest education level: Not on file  Occupational History  . Occupation: retired  Scientific laboratory technician  . Financial resource strain: Not on file  . Food insecurity:    Worry: Not on file    Inability: Not on file  . Transportation needs:    Medical: Not on file    Non-medical: Not on file  Tobacco Use  . Smoking status: Never Smoker  . Smokeless tobacco: Never Used  Substance and Sexual Activity  . Alcohol use: Yes    Comment: occasional  . Drug use: Not Currently    Types: Cocaine, Marijuana  . Sexual activity: Not on file  Lifestyle  . Physical activity:    Days per week: Not on file    Minutes per session: Not on file  . Stress: Not on file  Relationships  . Social connections:    Talks on phone: Not on file    Gets together: Not on file    Attends religious service: Not on file    Active member of club or organization: Not on file    Attends meetings of clubs or  organizations: Not on file    Relationship status: Not on file  Other Topics Concern  . Not on file  Social History Narrative  . Not on file    Allergies: No Known Allergies  Outpatient Meds: Current Outpatient Medications  Medication Sig Dispense Refill  . gabapentin (NEURONTIN) 300 MG capsule TK 2 CAPSULES PO QHS  2  . Misc Natural Products (COLON HERBAL CLEANSER PO) Take 1 tablet by mouth at bedtime. CLEANSE MORE    . Multiple Vitamin (MULTIVITAMIN) tablet Take 1 tablet by mouth daily.    . Multiple Vitamins-Minerals (WOMENS BONE HEALTH PO) Take 2 tablets by mouth 3 (three) times daily.    . Omega-3 Fatty Acids (FISH OIL PO) Take 1  tablet by mouth as needed.    Marland Kitchen PREMARIN vaginal cream   6   No current facility-administered medications for this visit.       ___________________________________________________________________ Objective   Exam:  BP 100/60 (BP Location: Left Arm, Patient Position: Sitting, Cuff Size: Normal)   Pulse 76   Ht 5' 6.54" (1.69 m) Comment: height measured without shoes  Wt 151 lb (68.5 kg)   BMI 23.98 kg/m    General: this is a(n) well-appearing woman  Eyes: sclera anicteric, no redness  ENT: oral mucosa moist without lesions, no cervical or supraclavicular lymphadenopathy, good dentition  CV: RRR without murmur, S1/S2, no JVD, no peripheral edema  Resp: clear to auscultation bilaterally, normal RR and effort noted  GI: soft, no tenderness, with active bowel sounds. No guarding or palpable organomegaly noted.  Active bowel sounds of normal character  Skin; warm and dry, no rash or jaundice noted  Neuro: awake, alert and oriented x 3. Normal gross motor function and fluent speech  Labs:  CMP Latest Ref Rng & Units 02/27/2009 02/10/2009  Glucose 70 - 99 mg/dL 105(H) 162(H)  BUN 6 - 23 mg/dL 7 13  Creatinine 0.4 - 1.2 mg/dL 0.82 0.82  Sodium 135 - 145 mEq/L 140 138  Potassium 3.5 - 5.1 mEq/L 4.3 3.4(L)  Chloride 96 - 112 mEq/L 105 102  CO2 19 - 32 mEq/L 28 21  Calcium 8.4 - 10.5 mg/dL 9.5 9.6  Total Protein 6.0 - 8.3 g/dL 6.9 7.5  Total Bilirubin 0.3 - 1.2 mg/dL 0.3 0.4  Alkaline Phos 39 - 117 U/L 116 109  AST 0 - 37 U/L 23 30  ALT 0 - 35 U/L 17 20   CBC Latest Ref Rng & Units 02/27/2009 02/10/2009 03/23/2007  WBC 4.0 - 10.5 K/uL 4.5 8.5 -  Hemoglobin 12.0 - 15.0 g/dL 13.6 13.5 13.7 POINT OF CARE RESULT  Hematocrit 36.0 - 46.0 % 40.2 38.8 -  Platelets 150 - 400 K/uL 275 221 -     Radiologic Studies:  Ct chest,abd,pelvis 06/21/17: CLINICAL DATA:  Difficulty breathing.  Insomnia.   EXAM: CT CHEST AND ABDOMEN WITHOUT CONTRAST   TECHNIQUE: Multidetector CT  imaging of the chest and abdomen was performed following the standard protocol without intravenous contrast.   COMPARISON:  None.   FINDINGS: CT CHEST FINDINGS WITHOUT CONTRAST   Cardiovascular: The heart is normal in size. No pericardial effusion. The aorta is normal in caliber. No significant atherosclerotic calcifications. No definite coronary artery calcifications.   Mediastinum/Nodes: No mediastinal or hilar mass or lymphadenopathy. There is a large hiatal hernia noted.   Lungs/Pleura: No acute pulmonary findings. No worrisome pulmonary lesions. No emphysematous changes, interstitial lung disease or bronchiectasis. No pleural effusion.   Musculoskeletal: Is no breast  masses, supraclavicular or axillary lymphadenopathy.   The bony structures are unremarkable.   CT ABDOMEN FINDINGS WITHOUT CONTRAST   Hepatobiliary: No focal hepatic lesions or intrahepatic biliary dilatation. The gallbladder is surgically absent. No common bile duct dilatation.   Pancreas: The pancreatic head appears somewhat prominent and bulbous. I do not see a discrete mass and this may be normal anatomic variation. However, I do not have any prior studies for comparison. No common bile duct or pancreatic ductal dilatation.   Spleen: Normal size.  No focal lesions.   Adrenals/Urinary Tract: The adrenal glands and kidneys are unremarkable.   Stomach/Bowel: The stomach, duodenum, visualized small bowel and visualize colon are unremarkable.   Vascular/Lymphatic: The aorta is normal in caliber. No atherosclerotic calcifications. No mesenteric or retroperitoneal mass or adenopathy.   Other: No ascites or abdominal wall hernia.   Musculoskeletal: No significant bony findings.   IMPRESSION: 1. Unremarkable CT appearance of the chest. No acute pulmonary findings, lung lesions or mediastinal/hilar mass or adenopathy. 2. Large hiatal hernia. 3. Prominent/bulbous pancreatic head, likely normal  anatomic variation but no prior studies for comparison. I would recommend a repeat CT scan of the abdomen with contrast with pancreatic protocol to exclude a pancreatic lesion.     Electronically Signed   By: Marijo Sanes M.D.   On: 06/22/2017 08:58   I personally reviewed the CT images.  Oral contrast only scan  Assessment: Encounter Diagnoses  Name Primary?  . Hiatal hernia Yes  . Abnormal finding on GI tract imaging     She has a large hiatal hernia but no symptoms. This nonspecific appearance of the head of the pancreas seems to be normal for her.  There is no biliary ductal dilatation, surrounding adenopathy, inflammation or elevated LFTs.  It appears to be of no clinical significance.  Plan:  I do not think she needs further GI testing or intervention at this point.  I have told her that it is a large hiatal hernia, and may develop symptoms at some point.  If she develops chronic heartburn, any dysphagia, odynophagia, nausea, vomiting, chest pain or any symptoms of concern to her, I would gladly reevaluate her.   Thank you for the courtesy of this consult.  Please call me with any questions or concerns.  Nelida Meuse III  CC: Cari Caraway, MD Kara Mead, MD

## 2017-07-13 ENCOUNTER — Ambulatory Visit
Admission: RE | Admit: 2017-07-13 | Discharge: 2017-07-13 | Disposition: A | Discharge status: Home / Self Care | Payer: 59 | Source: Ambulatory Visit | Attending: Family Medicine | Admitting: Family Medicine

## 2017-07-13 DIAGNOSIS — Z139 Encounter for screening, unspecified: Secondary | ICD-10-CM

## 2017-08-17 ENCOUNTER — Ambulatory Visit: Payer: 59 | Admitting: Internal Medicine

## 2018-08-18 ENCOUNTER — Other Ambulatory Visit (HOSPITAL_COMMUNITY)
Admission: RE | Admit: 2018-08-18 | Discharge: 2018-08-18 | Disposition: A | Discharge status: Home / Self Care | Payer: 59 | Source: Ambulatory Visit | Attending: Family Medicine | Admitting: Family Medicine

## 2018-08-18 ENCOUNTER — Other Ambulatory Visit: Payer: Self-pay | Admitting: Family Medicine

## 2018-08-18 DIAGNOSIS — Z124 Encounter for screening for malignant neoplasm of cervix: Secondary | ICD-10-CM | POA: Diagnosis present

## 2018-08-18 DIAGNOSIS — Z1231 Encounter for screening mammogram for malignant neoplasm of breast: Secondary | ICD-10-CM

## 2018-08-19 ENCOUNTER — Other Ambulatory Visit: Payer: Self-pay | Admitting: Family Medicine

## 2018-08-19 DIAGNOSIS — M810 Age-related osteoporosis without current pathological fracture: Secondary | ICD-10-CM

## 2018-08-22 LAB — CYTOLOGY - PAP
Diagnosis: NEGATIVE
HPV: NOT DETECTED

## 2018-10-04 ENCOUNTER — Other Ambulatory Visit: Payer: Self-pay

## 2018-10-04 DIAGNOSIS — Z20822 Contact with and (suspected) exposure to covid-19: Secondary | ICD-10-CM

## 2018-10-05 LAB — NOVEL CORONAVIRUS, NAA: SARS-CoV-2, NAA: NOT DETECTED

## 2019-02-02 HISTORY — PX: BREAST BIOPSY: SHX20

## 2019-02-09 ENCOUNTER — Ambulatory Visit: Payer: 59 | Attending: Internal Medicine

## 2019-02-09 DIAGNOSIS — Z20822 Contact with and (suspected) exposure to covid-19: Secondary | ICD-10-CM

## 2019-02-11 LAB — NOVEL CORONAVIRUS, NAA: SARS-CoV-2, NAA: NOT DETECTED

## 2019-02-27 ENCOUNTER — Ambulatory Visit: Payer: 59 | Attending: Internal Medicine

## 2019-02-27 DIAGNOSIS — Z20822 Contact with and (suspected) exposure to covid-19: Secondary | ICD-10-CM

## 2019-02-28 LAB — NOVEL CORONAVIRUS, NAA: SARS-CoV-2, NAA: NOT DETECTED

## 2019-03-12 ENCOUNTER — Ambulatory Visit: Payer: 59 | Attending: Internal Medicine

## 2019-03-12 DIAGNOSIS — Z20822 Contact with and (suspected) exposure to covid-19: Secondary | ICD-10-CM

## 2019-03-13 LAB — NOVEL CORONAVIRUS, NAA: SARS-CoV-2, NAA: NOT DETECTED

## 2019-03-30 ENCOUNTER — Ambulatory Visit
Admission: RE | Admit: 2019-03-30 | Discharge: 2019-03-30 | Disposition: A | Discharge status: Home / Self Care | Payer: 59 | Source: Ambulatory Visit | Attending: Family Medicine | Admitting: Family Medicine

## 2019-03-30 ENCOUNTER — Other Ambulatory Visit: Payer: Self-pay

## 2019-03-30 DIAGNOSIS — M810 Age-related osteoporosis without current pathological fracture: Secondary | ICD-10-CM

## 2019-03-30 DIAGNOSIS — Z1231 Encounter for screening mammogram for malignant neoplasm of breast: Secondary | ICD-10-CM

## 2019-04-02 ENCOUNTER — Other Ambulatory Visit: Payer: Self-pay | Admitting: Family Medicine

## 2019-04-02 DIAGNOSIS — N6489 Other specified disorders of breast: Secondary | ICD-10-CM

## 2019-04-17 ENCOUNTER — Ambulatory Visit
Admission: RE | Admit: 2019-04-17 | Discharge: 2019-04-17 | Disposition: A | Discharge status: Home / Self Care | Payer: 59 | Source: Ambulatory Visit | Attending: Family Medicine | Admitting: Family Medicine

## 2019-04-17 ENCOUNTER — Other Ambulatory Visit: Payer: Self-pay

## 2019-04-17 ENCOUNTER — Other Ambulatory Visit: Payer: Self-pay | Admitting: Family Medicine

## 2019-04-17 DIAGNOSIS — N6489 Other specified disorders of breast: Secondary | ICD-10-CM

## 2019-04-17 DIAGNOSIS — R921 Mammographic calcification found on diagnostic imaging of breast: Secondary | ICD-10-CM

## 2019-04-19 ENCOUNTER — Other Ambulatory Visit: Payer: Self-pay

## 2019-04-19 ENCOUNTER — Ambulatory Visit
Admission: RE | Admit: 2019-04-19 | Discharge: 2019-04-19 | Disposition: A | Discharge status: Home / Self Care | Payer: 59 | Source: Ambulatory Visit | Attending: Family Medicine | Admitting: Family Medicine

## 2019-04-19 DIAGNOSIS — R921 Mammographic calcification found on diagnostic imaging of breast: Secondary | ICD-10-CM

## 2020-04-09 DIAGNOSIS — M79672 Pain in left foot: Secondary | ICD-10-CM | POA: Diagnosis not present

## 2020-04-09 DIAGNOSIS — M25572 Pain in left ankle and joints of left foot: Secondary | ICD-10-CM | POA: Diagnosis not present

## 2020-04-18 ENCOUNTER — Other Ambulatory Visit: Payer: Self-pay | Admitting: Family Medicine

## 2020-04-18 DIAGNOSIS — Z1231 Encounter for screening mammogram for malignant neoplasm of breast: Secondary | ICD-10-CM

## 2020-06-11 ENCOUNTER — Ambulatory Visit
Admission: RE | Admit: 2020-06-11 | Discharge: 2020-06-11 | Disposition: A | Discharge status: Home / Self Care | Payer: Medicare HMO | Source: Ambulatory Visit | Attending: Family Medicine | Admitting: Family Medicine

## 2020-06-11 ENCOUNTER — Other Ambulatory Visit: Payer: Self-pay

## 2020-06-11 ENCOUNTER — Ambulatory Visit: Payer: 59

## 2020-06-11 DIAGNOSIS — Z1231 Encounter for screening mammogram for malignant neoplasm of breast: Secondary | ICD-10-CM | POA: Diagnosis not present

## 2020-06-16 DIAGNOSIS — H0288B Meibomian gland dysfunction left eye, upper and lower eyelids: Secondary | ICD-10-CM | POA: Diagnosis not present

## 2020-06-16 DIAGNOSIS — H2513 Age-related nuclear cataract, bilateral: Secondary | ICD-10-CM | POA: Diagnosis not present

## 2020-06-16 DIAGNOSIS — H0288A Meibomian gland dysfunction right eye, upper and lower eyelids: Secondary | ICD-10-CM | POA: Diagnosis not present

## 2020-06-16 DIAGNOSIS — H16223 Keratoconjunctivitis sicca, not specified as Sjogren's, bilateral: Secondary | ICD-10-CM | POA: Diagnosis not present

## 2020-10-24 DIAGNOSIS — M816 Localized osteoporosis [Lequesne]: Secondary | ICD-10-CM | POA: Diagnosis not present

## 2020-10-31 DIAGNOSIS — E673 Hypervitaminosis D: Secondary | ICD-10-CM | POA: Diagnosis not present

## 2020-10-31 DIAGNOSIS — N952 Postmenopausal atrophic vaginitis: Secondary | ICD-10-CM | POA: Diagnosis not present

## 2020-10-31 DIAGNOSIS — Z0000 Encounter for general adult medical examination without abnormal findings: Secondary | ICD-10-CM | POA: Diagnosis not present

## 2020-10-31 DIAGNOSIS — K5900 Constipation, unspecified: Secondary | ICD-10-CM | POA: Diagnosis not present

## 2020-10-31 DIAGNOSIS — Z23 Encounter for immunization: Secondary | ICD-10-CM | POA: Diagnosis not present

## 2020-10-31 DIAGNOSIS — J309 Allergic rhinitis, unspecified: Secondary | ICD-10-CM | POA: Diagnosis not present

## 2020-10-31 DIAGNOSIS — M85851 Other specified disorders of bone density and structure, right thigh: Secondary | ICD-10-CM | POA: Diagnosis not present

## 2020-10-31 DIAGNOSIS — Z13220 Encounter for screening for lipoid disorders: Secondary | ICD-10-CM | POA: Diagnosis not present

## 2020-12-23 DIAGNOSIS — R112 Nausea with vomiting, unspecified: Secondary | ICD-10-CM | POA: Diagnosis not present

## 2021-02-02 DIAGNOSIS — B349 Viral infection, unspecified: Secondary | ICD-10-CM | POA: Diagnosis not present

## 2021-02-27 DIAGNOSIS — E049 Nontoxic goiter, unspecified: Secondary | ICD-10-CM | POA: Diagnosis not present

## 2021-02-27 DIAGNOSIS — Z23 Encounter for immunization: Secondary | ICD-10-CM | POA: Diagnosis not present

## 2021-02-27 DIAGNOSIS — R5383 Other fatigue: Secondary | ICD-10-CM | POA: Diagnosis not present

## 2021-03-04 DIAGNOSIS — D1801 Hemangioma of skin and subcutaneous tissue: Secondary | ICD-10-CM | POA: Diagnosis not present

## 2021-03-04 DIAGNOSIS — L820 Inflamed seborrheic keratosis: Secondary | ICD-10-CM | POA: Diagnosis not present

## 2021-03-04 DIAGNOSIS — E049 Nontoxic goiter, unspecified: Secondary | ICD-10-CM | POA: Diagnosis not present

## 2021-03-04 DIAGNOSIS — D225 Melanocytic nevi of trunk: Secondary | ICD-10-CM | POA: Diagnosis not present

## 2021-03-04 DIAGNOSIS — E041 Nontoxic single thyroid nodule: Secondary | ICD-10-CM | POA: Diagnosis not present

## 2021-03-04 DIAGNOSIS — L821 Other seborrheic keratosis: Secondary | ICD-10-CM | POA: Diagnosis not present

## 2021-03-04 DIAGNOSIS — L578 Other skin changes due to chronic exposure to nonionizing radiation: Secondary | ICD-10-CM | POA: Diagnosis not present

## 2021-03-04 DIAGNOSIS — D2271 Melanocytic nevi of right lower limb, including hip: Secondary | ICD-10-CM | POA: Diagnosis not present

## 2021-04-03 DIAGNOSIS — R0989 Other specified symptoms and signs involving the circulatory and respiratory systems: Secondary | ICD-10-CM | POA: Diagnosis not present

## 2021-04-03 DIAGNOSIS — R051 Acute cough: Secondary | ICD-10-CM | POA: Diagnosis not present

## 2021-04-03 DIAGNOSIS — R0982 Postnasal drip: Secondary | ICD-10-CM | POA: Diagnosis not present

## 2021-04-03 DIAGNOSIS — J069 Acute upper respiratory infection, unspecified: Secondary | ICD-10-CM | POA: Diagnosis not present

## 2021-04-03 DIAGNOSIS — Z03818 Encounter for observation for suspected exposure to other biological agents ruled out: Secondary | ICD-10-CM | POA: Diagnosis not present

## 2021-04-15 DIAGNOSIS — J019 Acute sinusitis, unspecified: Secondary | ICD-10-CM | POA: Diagnosis not present

## 2021-05-15 DIAGNOSIS — M5412 Radiculopathy, cervical region: Secondary | ICD-10-CM | POA: Diagnosis not present

## 2021-05-15 DIAGNOSIS — M25511 Pain in right shoulder: Secondary | ICD-10-CM | POA: Diagnosis not present

## 2021-05-22 DIAGNOSIS — M25511 Pain in right shoulder: Secondary | ICD-10-CM | POA: Diagnosis not present

## 2021-05-28 DIAGNOSIS — M25511 Pain in right shoulder: Secondary | ICD-10-CM | POA: Diagnosis not present

## 2021-06-08 ENCOUNTER — Other Ambulatory Visit: Payer: Self-pay | Admitting: Family Medicine

## 2021-06-08 DIAGNOSIS — Z1231 Encounter for screening mammogram for malignant neoplasm of breast: Secondary | ICD-10-CM

## 2021-06-23 ENCOUNTER — Ambulatory Visit
Admission: RE | Admit: 2021-06-23 | Discharge: 2021-06-23 | Disposition: A | Discharge status: Home / Self Care | Payer: Medicare HMO | Source: Ambulatory Visit | Attending: Family Medicine | Admitting: Family Medicine

## 2021-06-23 DIAGNOSIS — Z1231 Encounter for screening mammogram for malignant neoplasm of breast: Secondary | ICD-10-CM

## 2021-06-25 DIAGNOSIS — H16223 Keratoconjunctivitis sicca, not specified as Sjogren's, bilateral: Secondary | ICD-10-CM | POA: Diagnosis not present

## 2021-06-25 DIAGNOSIS — H0288A Meibomian gland dysfunction right eye, upper and lower eyelids: Secondary | ICD-10-CM | POA: Diagnosis not present

## 2021-06-25 DIAGNOSIS — H0288B Meibomian gland dysfunction left eye, upper and lower eyelids: Secondary | ICD-10-CM | POA: Diagnosis not present

## 2021-06-25 DIAGNOSIS — H2513 Age-related nuclear cataract, bilateral: Secondary | ICD-10-CM | POA: Diagnosis not present

## 2021-09-04 DIAGNOSIS — E041 Nontoxic single thyroid nodule: Secondary | ICD-10-CM | POA: Diagnosis not present

## 2021-09-15 DIAGNOSIS — H2512 Age-related nuclear cataract, left eye: Secondary | ICD-10-CM | POA: Diagnosis not present

## 2021-09-15 DIAGNOSIS — H25043 Posterior subcapsular polar age-related cataract, bilateral: Secondary | ICD-10-CM | POA: Diagnosis not present

## 2021-09-15 DIAGNOSIS — H2513 Age-related nuclear cataract, bilateral: Secondary | ICD-10-CM | POA: Diagnosis not present

## 2021-09-15 DIAGNOSIS — H18593 Other hereditary corneal dystrophies, bilateral: Secondary | ICD-10-CM | POA: Diagnosis not present

## 2021-09-15 DIAGNOSIS — H18413 Arcus senilis, bilateral: Secondary | ICD-10-CM | POA: Diagnosis not present

## 2021-10-30 DIAGNOSIS — Z13220 Encounter for screening for lipoid disorders: Secondary | ICD-10-CM | POA: Diagnosis not present

## 2021-10-30 DIAGNOSIS — E673 Hypervitaminosis D: Secondary | ICD-10-CM | POA: Diagnosis not present

## 2021-10-30 DIAGNOSIS — M85851 Other specified disorders of bone density and structure, right thigh: Secondary | ICD-10-CM | POA: Diagnosis not present

## 2021-10-30 DIAGNOSIS — Z136 Encounter for screening for cardiovascular disorders: Secondary | ICD-10-CM | POA: Diagnosis not present

## 2021-11-02 DIAGNOSIS — H25013 Cortical age-related cataract, bilateral: Secondary | ICD-10-CM | POA: Diagnosis not present

## 2021-11-02 DIAGNOSIS — H2513 Age-related nuclear cataract, bilateral: Secondary | ICD-10-CM | POA: Diagnosis not present

## 2021-11-02 DIAGNOSIS — H2511 Age-related nuclear cataract, right eye: Secondary | ICD-10-CM | POA: Diagnosis not present

## 2021-11-02 DIAGNOSIS — H25043 Posterior subcapsular polar age-related cataract, bilateral: Secondary | ICD-10-CM | POA: Diagnosis not present

## 2021-11-02 DIAGNOSIS — H2512 Age-related nuclear cataract, left eye: Secondary | ICD-10-CM | POA: Diagnosis not present

## 2021-11-02 DIAGNOSIS — H18413 Arcus senilis, bilateral: Secondary | ICD-10-CM | POA: Diagnosis not present

## 2021-11-05 ENCOUNTER — Other Ambulatory Visit: Payer: Self-pay | Admitting: Family Medicine

## 2021-11-05 DIAGNOSIS — Z23 Encounter for immunization: Secondary | ICD-10-CM | POA: Diagnosis not present

## 2021-11-05 DIAGNOSIS — Z01419 Encounter for gynecological examination (general) (routine) without abnormal findings: Secondary | ICD-10-CM | POA: Diagnosis not present

## 2021-11-05 DIAGNOSIS — N952 Postmenopausal atrophic vaginitis: Secondary | ICD-10-CM | POA: Diagnosis not present

## 2021-11-05 DIAGNOSIS — E041 Nontoxic single thyroid nodule: Secondary | ICD-10-CM | POA: Diagnosis not present

## 2021-11-05 DIAGNOSIS — Z0000 Encounter for general adult medical examination without abnormal findings: Secondary | ICD-10-CM | POA: Diagnosis not present

## 2021-11-05 DIAGNOSIS — M85851 Other specified disorders of bone density and structure, right thigh: Secondary | ICD-10-CM | POA: Diagnosis not present

## 2021-11-05 DIAGNOSIS — Z6828 Body mass index (BMI) 28.0-28.9, adult: Secondary | ICD-10-CM | POA: Diagnosis not present

## 2021-11-05 DIAGNOSIS — Z1331 Encounter for screening for depression: Secondary | ICD-10-CM | POA: Diagnosis not present

## 2021-12-14 DIAGNOSIS — H2513 Age-related nuclear cataract, bilateral: Secondary | ICD-10-CM | POA: Diagnosis not present

## 2021-12-15 DIAGNOSIS — H2512 Age-related nuclear cataract, left eye: Secondary | ICD-10-CM | POA: Diagnosis not present

## 2021-12-15 DIAGNOSIS — H269 Unspecified cataract: Secondary | ICD-10-CM | POA: Diagnosis not present

## 2021-12-15 DIAGNOSIS — H2511 Age-related nuclear cataract, right eye: Secondary | ICD-10-CM | POA: Diagnosis not present

## 2021-12-21 DIAGNOSIS — H2513 Age-related nuclear cataract, bilateral: Secondary | ICD-10-CM | POA: Diagnosis not present

## 2021-12-22 DIAGNOSIS — H269 Unspecified cataract: Secondary | ICD-10-CM | POA: Diagnosis not present

## 2021-12-22 DIAGNOSIS — H2512 Age-related nuclear cataract, left eye: Secondary | ICD-10-CM | POA: Diagnosis not present

## 2022-01-28 DIAGNOSIS — Z0100 Encounter for examination of eyes and vision without abnormal findings: Secondary | ICD-10-CM | POA: Diagnosis not present

## 2022-04-05 DIAGNOSIS — H02831 Dermatochalasis of right upper eyelid: Secondary | ICD-10-CM | POA: Diagnosis not present

## 2022-04-05 DIAGNOSIS — Z961 Presence of intraocular lens: Secondary | ICD-10-CM | POA: Diagnosis not present

## 2022-04-05 DIAGNOSIS — H18413 Arcus senilis, bilateral: Secondary | ICD-10-CM | POA: Diagnosis not present

## 2022-04-05 DIAGNOSIS — H26492 Other secondary cataract, left eye: Secondary | ICD-10-CM | POA: Diagnosis not present

## 2022-04-20 DIAGNOSIS — D2271 Melanocytic nevi of right lower limb, including hip: Secondary | ICD-10-CM | POA: Diagnosis not present

## 2022-04-20 DIAGNOSIS — D2262 Melanocytic nevi of left upper limb, including shoulder: Secondary | ICD-10-CM | POA: Diagnosis not present

## 2022-04-20 DIAGNOSIS — L570 Actinic keratosis: Secondary | ICD-10-CM | POA: Diagnosis not present

## 2022-04-20 DIAGNOSIS — D225 Melanocytic nevi of trunk: Secondary | ICD-10-CM | POA: Diagnosis not present

## 2022-04-20 DIAGNOSIS — L821 Other seborrheic keratosis: Secondary | ICD-10-CM | POA: Diagnosis not present

## 2022-04-20 DIAGNOSIS — L814 Other melanin hyperpigmentation: Secondary | ICD-10-CM | POA: Diagnosis not present

## 2022-04-26 DIAGNOSIS — H26491 Other secondary cataract, right eye: Secondary | ICD-10-CM | POA: Diagnosis not present

## 2022-05-13 ENCOUNTER — Ambulatory Visit
Admission: RE | Admit: 2022-05-13 | Discharge: 2022-05-13 | Disposition: A | Discharge status: Home / Self Care | Payer: HMO | Source: Ambulatory Visit | Attending: Family Medicine | Admitting: Family Medicine

## 2022-05-13 DIAGNOSIS — M8589 Other specified disorders of bone density and structure, multiple sites: Secondary | ICD-10-CM | POA: Diagnosis not present

## 2022-05-13 DIAGNOSIS — Z780 Asymptomatic menopausal state: Secondary | ICD-10-CM | POA: Diagnosis not present

## 2022-05-13 DIAGNOSIS — M85851 Other specified disorders of bone density and structure, right thigh: Secondary | ICD-10-CM

## 2022-05-24 ENCOUNTER — Other Ambulatory Visit: Payer: Self-pay | Admitting: Family Medicine

## 2022-05-24 DIAGNOSIS — Z1231 Encounter for screening mammogram for malignant neoplasm of breast: Secondary | ICD-10-CM

## 2022-06-29 ENCOUNTER — Ambulatory Visit
Admission: RE | Admit: 2022-06-29 | Discharge: 2022-06-29 | Disposition: A | Discharge status: Home / Self Care | Payer: HMO | Source: Ambulatory Visit | Attending: Family Medicine | Admitting: Family Medicine

## 2022-06-29 DIAGNOSIS — Z1231 Encounter for screening mammogram for malignant neoplasm of breast: Secondary | ICD-10-CM | POA: Diagnosis not present

## 2022-06-30 DIAGNOSIS — L570 Actinic keratosis: Secondary | ICD-10-CM | POA: Diagnosis not present

## 2022-07-15 DIAGNOSIS — H18421 Band keratopathy, right eye: Secondary | ICD-10-CM | POA: Diagnosis not present

## 2022-07-20 DIAGNOSIS — M25531 Pain in right wrist: Secondary | ICD-10-CM | POA: Diagnosis not present

## 2022-08-10 DIAGNOSIS — M25531 Pain in right wrist: Secondary | ICD-10-CM | POA: Diagnosis not present

## 2022-11-01 NOTE — Progress Notes (Shared)
[facility] - Clinic Note  11/04/2022   CHIEF COMPLAINT Patient presents for Retina Evaluation  HISTORY OF PRESENT ILLNESS: Lacey Richardson is a 67 y.o. female who presents to the clinic today for:  HPI     Retina Evaluation   In left eye.  This started 1 year ago.  Duration of 1 year.  Associated Symptoms Floaters, Pain and Photophobia.  I, the attending physician,  performed the HPI with the patient and updated documentation appropriately.        Comments   Patient here for Retina Evaluation. Referred by Dr Delaney Meigs. Patient states vision OD is okay. OS wonky without contact lens. Saw Dr Delaney Meigs on Friday for regular follow up. He put a contact lens in OS. Was given a Lastacaft drop to use. Yesterday OS was hurting and light sensitive so She took out the contact lens. Thinks it is the drop.Has had cataract surgery OU. Vision was worse after cataract surgery. Has had multiple procedures. Yag OU PRK OU. Uses AT's prn.       Last edited by Rennis Chris, MD on 11/04/2022  2:54 PM.    Pt is here on the referral of Dr. Delaney Meigs for concern of ERM OS, pt has had multiple procudures including PTK/PRK/SK/AM, pt states last November she had cataract sx with Dr. Delaney Meigs, pt states he was supposed to correct her astigmatism during the sx as well, she states after sx her vision was "horrible" in both eyes for 3 months, she states she got glasses that "kind of worked", pt states after 3 months, she had Yag OU, which didn't really help her vision, she states after that she was told she needed PRK in both eyes, she states her right eye is almost back to where it was prior to sx, she states Friday she had a consult with Dr. Delaney Meigs and Dr. Graciella Freer and was given a CL for her left eye, which did help her distance vision, she states yesterday her eye started hurting, so she took the lens out and her eye has been watering and light sensitive since then, she has been  using lastacaft, but did not use it yesterday, she had lasik 20 years ago with [facility], pt states she has floaters now that she didn't have prior to sx, she feels like they are in both eyes   Referring physician: Tyrone Schimke, MD [address] [place],  [place] [address]  HISTORICAL INFORMATION:  Selected notes from the MEDICAL RECORD NUMBER Referred by Dr. 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 Delaney Meigs), PC IOL OS (11.21.23 -- Dr. Delaney Meigs), YAG Cap OS (03.04.24), YAG Cap OD (03.25.24), s/p PTK/PRK/SK/AM OU for ABMD, hyperopia OS PMH-   CURRENT MEDICATIONS: No current outpatient medications on file. (Ophthalmic Drugs)   No current facility-administered medications for this visit. (Ophthalmic Drugs)   Current Outpatient Medications (Other)  Medication Sig   Misc Natural Products (COLON HERBAL CLEANSER PO) Take 1 tablet by mouth at bedtime. CLEANSE MORE   Multiple Vitamin (MULTIVITAMIN) tablet Take 1 tablet by mouth daily.   Multiple Vitamins-Minerals (WOMENS BONE HEALTH PO) Take 2 tablets by mouth 3 (three) times daily.   Omega-3 Fatty Acids (FISH OIL PO) Take 1 tablet by mouth as needed.   PREMARIN vaginal cream    gabapentin (NEURONTIN) 300 MG capsule TK 2 CAPSULES PO QHS   No current facility-administered medications for this visit. (Other)  REVIEW OF SYSTEMS: ROS   Positive for: Eyes Last edited by Laddie Aquas, COA on 11/04/2022  8:31 AM.     ALLERGIES No Known Allergies PAST MEDICAL HISTORY Past Medical History:  Diagnosis Date   Anal fissure    Colon polyp    Gallstones    Hiatal hernia    Past Surgical History:  Procedure Laterality Date   BREAST BIOPSY Right 2021   GALLBLADDER SURGERY     SHOULDER SURGERY Right    FAMILY HISTORY Family History  Problem Relation Age of Onset   Breast cancer Mother    Ovarian cancer Mother        mets   Diabetes Father    Rheum  arthritis Maternal Grandmother    SOCIAL HISTORY Social History   Tobacco Use   Smoking status: Never   Smokeless tobacco: Never  Substance Use Topics   Alcohol use: Yes    Comment: occasional   Drug use: Not Currently    Types: Cocaine, Marijuana       OPHTHALMIC EXAM:  Base Eye Exam     Visual Acuity (Snellen - Linear)       Right Left   Dist [place] 20/25 +1 20/150   Dist ph [place] NI 20/30 -2         Tonometry (Tonopen, 8:22 AM)       Right Left   Pressure 13 11         Pupils       Dark Light Shape React APD   Right 3 2 Round Minimal None   Left 3 2 Round Minimal None         Visual Fields (Counting fingers)       Left Right    Full Full         Extraocular Movement       Right Left    Full Full ENT    -- -- --  --  --  -- -- --   -- -- --  --  --  -- -- --           Neuro/Psych     Oriented x3: Yes   Mood/Affect: Normal         Dilation     Both eyes: 1.0% Mydriacyl, 2.5% Phenylephrine @ 8:22 AM           Slit Lamp and Fundus Exam     Slit Lamp Exam       Right Left   Lids/Lashes Dermatochalasis - upper lid Dermatochalasis - upper lid   Conjunctiva/Sclera White and quiet Trace Injection   Cornea mild arcus, trace PEE, trace tear film debris, well healed cataract wound, lasik flap not visible arcus, 1+PEE, punctate infiltrate at 0430 midzone, well healed cataract wound superiorly   Anterior Chamber deep and clear deep and clear   Iris Round and dilated Round and dilated   Lens PC IOL in good position with open PC PC IOL in good position with open PC   Anterior Vitreous mild syneresis mild syneresis         Fundus Exam       Right Left   Disc trace Pallor, Sharp rim Pink and Sharp   C/D Ratio 0.3 0.3   Macula Flat, Good foveal reflex, No heme or edema Flat, Good foveal reflex, No heme or edema   Vessels mild attenuation, mild tortuosity mild attenuation, mild tortuosity   Periphery Attached, No heme Attached, No  heme  Refraction     Wearing Rx       Sphere Cylinder Axis   Right +0.25 +1.25 052   Left +1.50 +1.00 023         Manifest Refraction       Sphere Cylinder Axis Dist VA   Right       Left +2.75 +1.50 020 20/25+2           IMAGING AND PROCEDURES  Imaging and Procedures for 11/04/2022  OCT, Retina - OU - Both Eyes       Right Eye Quality was good. Central Foveal Thickness: 281. Progression has no prior data. Findings include normal foveal contour, no IRF, no SRF.   Left Eye Quality was good. Central Foveal Thickness: 281. Progression has no prior data. Findings include normal foveal contour, no IRF, no SRF (No ERM).   Notes *Images captured and stored on drive  Diagnosis / Impression:  NFP, no IRF/SRF OU No ERM OU  Clinical management:  See below  Abbreviations: NFP - Normal foveal profile. CME - cystoid macular edema. PED - pigment epithelial detachment. IRF - intraretinal fluid. SRF - subretinal fluid. EZ - ellipsoid zone. ERM - epiretinal membrane. ORA - outer retinal atrophy. ORT - outer retinal tubulation. SRHM - subretinal hyper-reflective material. IRHM - intraretinal hyper-reflective material           ASSESSMENT/PLAN:   ICD-10-CM   1. Retinal edema  H35.81 OCT, Retina - OU - Both Eyes    2. Pseudophakia, both eyes  Z96.1     3. 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 Hx of LASIK  Z98.890     5. Keratopathy  H18.9      1. No retinal edema on exam or OCT  - no ERM on exam or OCT OS  - no retinal interventions indicated or recommended  - clear from a retina standpoint to proceed with any anterior segment procedures when pt and surgeon are ready   2. Pseudophakia OU  - s/p CE/IOLs (Dr. Delaney Meigs, OD: 11.14.23, OS: 11.21.23)  - IOLs in good position  3. ABMD s/p PTK/PRK/SK/AM OU  - under the expert management of Dr. Delaney Meigs  4. Hx of LASIK  - early 2000s w/ Dr. Delaney Meigs  5.  Corneal infiltrate - contact lens related  - pt reports 1 day history of irritation, redness and FBS OS  - was placed in soft contact lens last Friday 09.27.24  - exam shows punctate corneal infiltrate 0430 midzone  - pt scheduled to see Dr. Delaney Meigs at noon today -- will defer therapy to Dr. Delaney Meigs   Ophthalmic Meds Ordered this visit:  No orders of the defined types were placed in this encounter.    Return if symptoms worsen or fail to improve.  There are no Patient Instructions on file for this visit.  Explained the diagnoses, plan, and follow up with the patient and they expressed understanding.  Patient expressed understanding of the importance of proper follow up care.   Karie Chimera, M.D., Ph.D. Diseases & Surgery of the Retina and Vitreous [facility] 11/04/2022  I have reviewed the above documentation for accuracy and completeness, and I agree with the above. Karie Chimera, M.D., Ph.D. 11/04/22 3:25 PM   Abbreviations: M myopia (nearsighted); A astigmatism; H hyperopia (farsighted); P presbyopia; Mrx spectacle prescription;  CTL contact lenses; OD right eye; OS left eye; OU both eyes  XT  exotropia; ET esotropia; PEK punctate epithelial keratitis; PEE punctate epithelial erosions; DES dry eye syndrome; MGD meibomian gland dysfunction; ATs artificial tears; PFAT's preservative free artificial tears; NSC nuclear sclerotic cataract; PSC posterior subcapsular cataract; ERM epi-retinal membrane; PVD posterior vitreous detachment; RD retinal detachment; DM diabetes mellitus; DR diabetic retinopathy; NPDR non-proliferative diabetic retinopathy; PDR proliferative diabetic retinopathy; CSME clinically significant macular edema; DME diabetic macular edema; dbh dot blot hemorrhages; CWS cotton wool spot; POAG primary open angle glaucoma; C/D cup-to-disc ratio; HVF humphrey visual field; GVF goldmann visual field; OCT optical coherence tomography; IOP  intraocular pressure; BRVO Branch retinal vein occlusion; CRVO central retinal vein occlusion; CRAO central retinal artery occlusion; BRAO branch retinal artery occlusion; RT retinal tear; SB scleral buckle; PPV pars plana vitrectomy; VH Vitreous hemorrhage; PRP panretinal laser photocoagulation; IVK intravitreal kenalog; VMT vitreomacular traction; MH Macular hole;  NVD neovascularization of the disc; NVE neovascularization elsewhere; AREDS age related eye disease study; ARMD age related macular degeneration; POAG primary open angle glaucoma; EBMD epithelial/anterior basement membrane dystrophy; ACIOL anterior chamber intraocular lens; IOL intraocular lens; PCIOL posterior chamber intraocular lens; Phaco/IOL phacoemulsification with intraocular lens placement; PRK photorefractive keratectomy; LASIK laser assisted in situ keratomileusis; HTN hypertension; DM diabetes mellitus; COPD chronic obstructive pulmonary disease

## 2022-11-04 ENCOUNTER — Ambulatory Visit (INDEPENDENT_AMBULATORY_CARE_PROVIDER_SITE_OTHER): Payer: HMO | Admitting: Ophthalmology

## 2022-11-04 ENCOUNTER — Encounter (INDEPENDENT_AMBULATORY_CARE_PROVIDER_SITE_OTHER): Payer: Self-pay | Admitting: Ophthalmology

## 2022-11-04 DIAGNOSIS — H18523 Epithelial (juvenile) corneal dystrophy, bilateral: Secondary | ICD-10-CM | POA: Diagnosis not present

## 2022-11-04 DIAGNOSIS — H5202 Hypermetropia, left eye: Secondary | ICD-10-CM | POA: Diagnosis not present

## 2022-11-04 DIAGNOSIS — H35372 Puckering of macula, left eye: Secondary | ICD-10-CM | POA: Diagnosis not present

## 2022-11-04 DIAGNOSIS — H189 Unspecified disorder of cornea: Secondary | ICD-10-CM

## 2022-11-04 DIAGNOSIS — H3581 Retinal edema: Secondary | ICD-10-CM

## 2022-11-04 DIAGNOSIS — Z9889 Other specified postprocedural states: Secondary | ICD-10-CM

## 2022-11-04 DIAGNOSIS — H5231 Anisometropia: Secondary | ICD-10-CM | POA: Diagnosis not present

## 2022-11-04 DIAGNOSIS — Z961 Presence of intraocular lens: Secondary | ICD-10-CM

## 2022-11-04 DIAGNOSIS — H16042 Marginal corneal ulcer, left eye: Secondary | ICD-10-CM | POA: Diagnosis not present

## 2023-01-13 DIAGNOSIS — M816 Localized osteoporosis [Lequesne]: Secondary | ICD-10-CM | POA: Diagnosis not present

## 2023-01-13 DIAGNOSIS — E7800 Pure hypercholesterolemia, unspecified: Secondary | ICD-10-CM | POA: Diagnosis not present

## 2023-01-17 DIAGNOSIS — E7800 Pure hypercholesterolemia, unspecified: Secondary | ICD-10-CM | POA: Diagnosis not present

## 2023-01-17 DIAGNOSIS — Z6828 Body mass index (BMI) 28.0-28.9, adult: Secondary | ICD-10-CM | POA: Diagnosis not present

## 2023-01-17 DIAGNOSIS — M85851 Other specified disorders of bone density and structure, right thigh: Secondary | ICD-10-CM | POA: Diagnosis not present

## 2023-01-17 DIAGNOSIS — I700 Atherosclerosis of aorta: Secondary | ICD-10-CM | POA: Diagnosis not present

## 2023-01-17 DIAGNOSIS — Z0000 Encounter for general adult medical examination without abnormal findings: Secondary | ICD-10-CM | POA: Diagnosis not present

## 2023-01-17 DIAGNOSIS — N952 Postmenopausal atrophic vaginitis: Secondary | ICD-10-CM | POA: Diagnosis not present

## 2023-01-17 DIAGNOSIS — Z1331 Encounter for screening for depression: Secondary | ICD-10-CM | POA: Diagnosis not present

## 2023-01-18 ENCOUNTER — Encounter (HOSPITAL_BASED_OUTPATIENT_CLINIC_OR_DEPARTMENT_OTHER): Payer: Self-pay | Admitting: Family Medicine

## 2023-01-18 ENCOUNTER — Other Ambulatory Visit (HOSPITAL_BASED_OUTPATIENT_CLINIC_OR_DEPARTMENT_OTHER): Payer: Self-pay | Admitting: Family Medicine

## 2023-01-18 DIAGNOSIS — I700 Atherosclerosis of aorta: Secondary | ICD-10-CM

## 2023-03-17 ENCOUNTER — Ambulatory Visit (HOSPITAL_BASED_OUTPATIENT_CLINIC_OR_DEPARTMENT_OTHER)
Admission: RE | Admit: 2023-03-17 | Discharge: 2023-03-17 | Disposition: A | Discharge status: Home / Self Care | Payer: Self-pay | Source: Ambulatory Visit | Attending: Family Medicine | Admitting: Family Medicine

## 2023-03-17 DIAGNOSIS — I700 Atherosclerosis of aorta: Secondary | ICD-10-CM | POA: Insufficient documentation

## 2023-04-28 DIAGNOSIS — M25561 Pain in right knee: Secondary | ICD-10-CM | POA: Diagnosis not present

## 2023-05-16 ENCOUNTER — Other Ambulatory Visit: Payer: Self-pay | Admitting: Family Medicine

## 2023-05-16 DIAGNOSIS — Z1231 Encounter for screening mammogram for malignant neoplasm of breast: Secondary | ICD-10-CM

## 2023-05-24 DIAGNOSIS — S83231A Complex tear of medial meniscus, current injury, right knee, initial encounter: Secondary | ICD-10-CM | POA: Diagnosis not present

## 2023-05-24 DIAGNOSIS — S83411A Sprain of medial collateral ligament of right knee, initial encounter: Secondary | ICD-10-CM | POA: Diagnosis not present

## 2023-06-06 DIAGNOSIS — M2351 Chronic instability of knee, right knee: Secondary | ICD-10-CM | POA: Diagnosis not present

## 2023-06-06 DIAGNOSIS — S83241A Other tear of medial meniscus, current injury, right knee, initial encounter: Secondary | ICD-10-CM | POA: Diagnosis not present

## 2023-06-06 DIAGNOSIS — M25561 Pain in right knee: Secondary | ICD-10-CM | POA: Diagnosis not present

## 2023-06-21 DIAGNOSIS — C44219 Basal cell carcinoma of skin of left ear and external auricular canal: Secondary | ICD-10-CM | POA: Diagnosis not present

## 2023-06-21 DIAGNOSIS — D225 Melanocytic nevi of trunk: Secondary | ICD-10-CM | POA: Diagnosis not present

## 2023-06-21 DIAGNOSIS — L570 Actinic keratosis: Secondary | ICD-10-CM | POA: Diagnosis not present

## 2023-06-21 DIAGNOSIS — L819 Disorder of pigmentation, unspecified: Secondary | ICD-10-CM | POA: Diagnosis not present

## 2023-06-21 DIAGNOSIS — L814 Other melanin hyperpigmentation: Secondary | ICD-10-CM | POA: Diagnosis not present

## 2023-06-21 DIAGNOSIS — L738 Other specified follicular disorders: Secondary | ICD-10-CM | POA: Diagnosis not present

## 2023-06-21 DIAGNOSIS — L821 Other seborrheic keratosis: Secondary | ICD-10-CM | POA: Diagnosis not present

## 2023-06-21 DIAGNOSIS — D1801 Hemangioma of skin and subcutaneous tissue: Secondary | ICD-10-CM | POA: Diagnosis not present

## 2023-07-01 ENCOUNTER — Ambulatory Visit
Admission: RE | Admit: 2023-07-01 | Discharge: 2023-07-01 | Disposition: A | Discharge status: Home / Self Care | Source: Ambulatory Visit | Attending: Family Medicine | Admitting: Family Medicine

## 2023-07-01 DIAGNOSIS — Z1231 Encounter for screening mammogram for malignant neoplasm of breast: Secondary | ICD-10-CM

## 2023-07-02 DIAGNOSIS — E7800 Pure hypercholesterolemia, unspecified: Secondary | ICD-10-CM | POA: Diagnosis not present

## 2023-07-02 DIAGNOSIS — M816 Localized osteoporosis [Lequesne]: Secondary | ICD-10-CM | POA: Diagnosis not present

## 2023-07-06 DIAGNOSIS — H5232 Aniseikonia: Secondary | ICD-10-CM | POA: Diagnosis not present

## 2023-07-06 DIAGNOSIS — H52212 Irregular astigmatism, left eye: Secondary | ICD-10-CM | POA: Diagnosis not present

## 2023-07-06 DIAGNOSIS — Z9889 Other specified postprocedural states: Secondary | ICD-10-CM | POA: Diagnosis not present

## 2023-07-06 DIAGNOSIS — Z961 Presence of intraocular lens: Secondary | ICD-10-CM | POA: Diagnosis not present

## 2023-07-06 DIAGNOSIS — H5231 Anisometropia: Secondary | ICD-10-CM | POA: Diagnosis not present

## 2023-07-28 DIAGNOSIS — Z85828 Personal history of other malignant neoplasm of skin: Secondary | ICD-10-CM | POA: Diagnosis not present

## 2023-07-28 DIAGNOSIS — C44219 Basal cell carcinoma of skin of left ear and external auricular canal: Secondary | ICD-10-CM | POA: Diagnosis not present

## 2023-08-18 DIAGNOSIS — C44219 Basal cell carcinoma of skin of left ear and external auricular canal: Secondary | ICD-10-CM | POA: Diagnosis not present

## 2023-09-01 DIAGNOSIS — E7800 Pure hypercholesterolemia, unspecified: Secondary | ICD-10-CM | POA: Diagnosis not present

## 2023-09-01 DIAGNOSIS — M816 Localized osteoporosis [Lequesne]: Secondary | ICD-10-CM | POA: Diagnosis not present

## 2023-10-02 DIAGNOSIS — E7800 Pure hypercholesterolemia, unspecified: Secondary | ICD-10-CM | POA: Diagnosis not present

## 2023-10-02 DIAGNOSIS — M816 Localized osteoporosis [Lequesne]: Secondary | ICD-10-CM | POA: Diagnosis not present

## 2023-10-05 DIAGNOSIS — H52212 Irregular astigmatism, left eye: Secondary | ICD-10-CM | POA: Diagnosis not present

## 2023-10-05 DIAGNOSIS — Z961 Presence of intraocular lens: Secondary | ICD-10-CM | POA: Diagnosis not present

## 2023-10-05 DIAGNOSIS — L718 Other rosacea: Secondary | ICD-10-CM | POA: Diagnosis not present

## 2023-10-05 DIAGNOSIS — Z9889 Other specified postprocedural states: Secondary | ICD-10-CM | POA: Diagnosis not present

## 2023-10-05 DIAGNOSIS — H5231 Anisometropia: Secondary | ICD-10-CM | POA: Diagnosis not present

## 2023-10-05 DIAGNOSIS — H5232 Aniseikonia: Secondary | ICD-10-CM | POA: Diagnosis not present

## 2023-10-20 IMAGING — MG MM DIGITAL SCREENING BILAT W/ TOMO AND CAD
6 of 10 series · 6 of 30 positions shown · non-contrast
Comparison: Previous exam(s).

CLINICAL DATA: Screening.

EXAM:
DIGITAL SCREENING BILATERAL MAMMOGRAM WITH TOMOSYNTHESIS AND CAD
TECHNIQUE: Bilateral screening digital craniocaudal and mediolateral oblique
mammograms were obtained. Bilateral screening digital breast
tomosynthesis was performed. The images were evaluated with
computer-aided detection.

[R CC synth-2D]
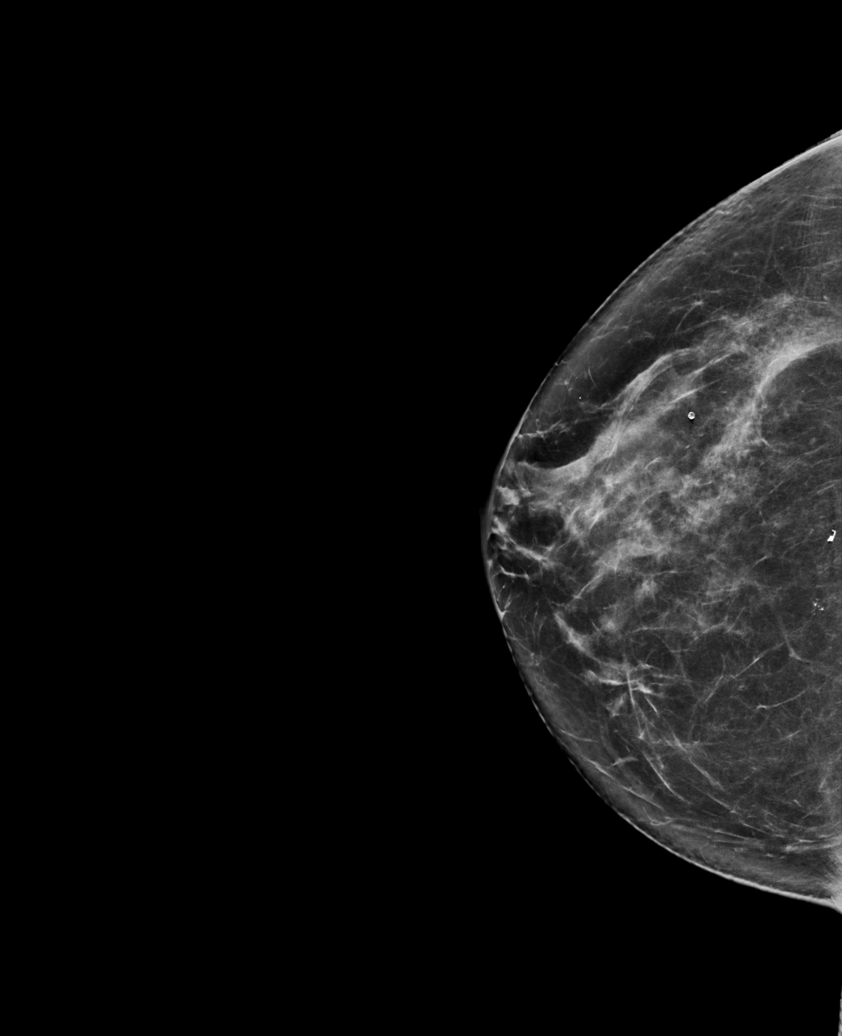

[R MLO synth-2D (1 of 2)]
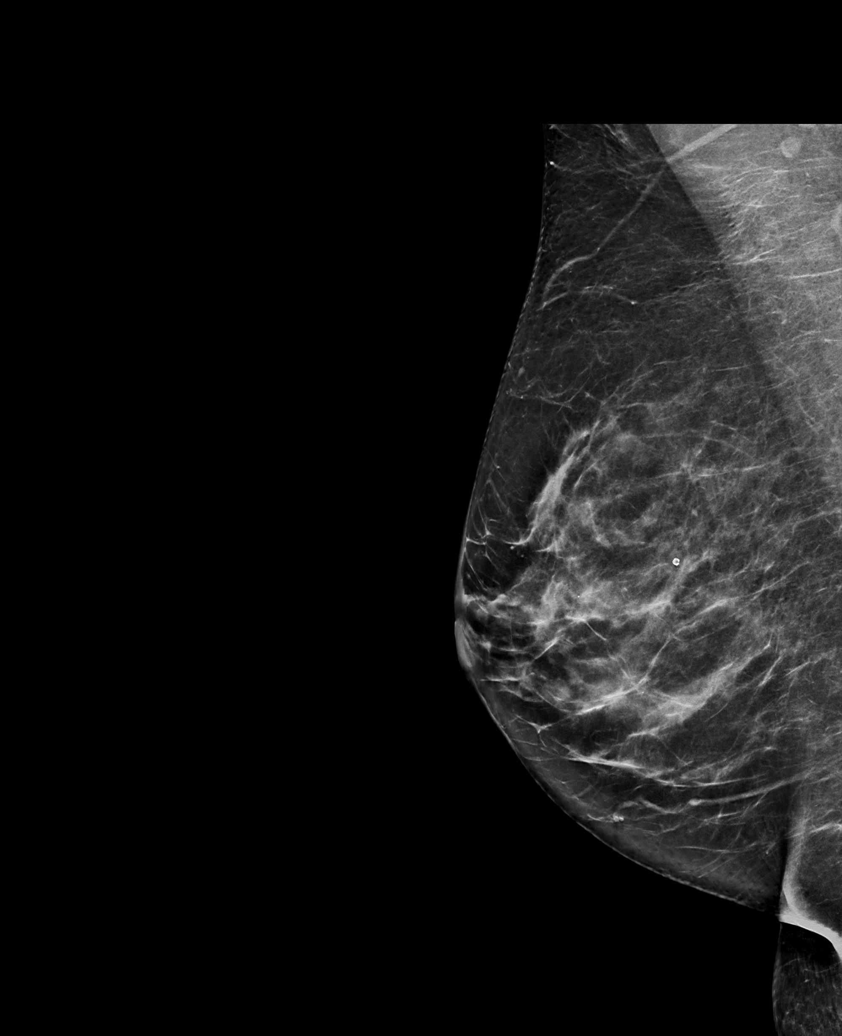

[L CC synth-2D]
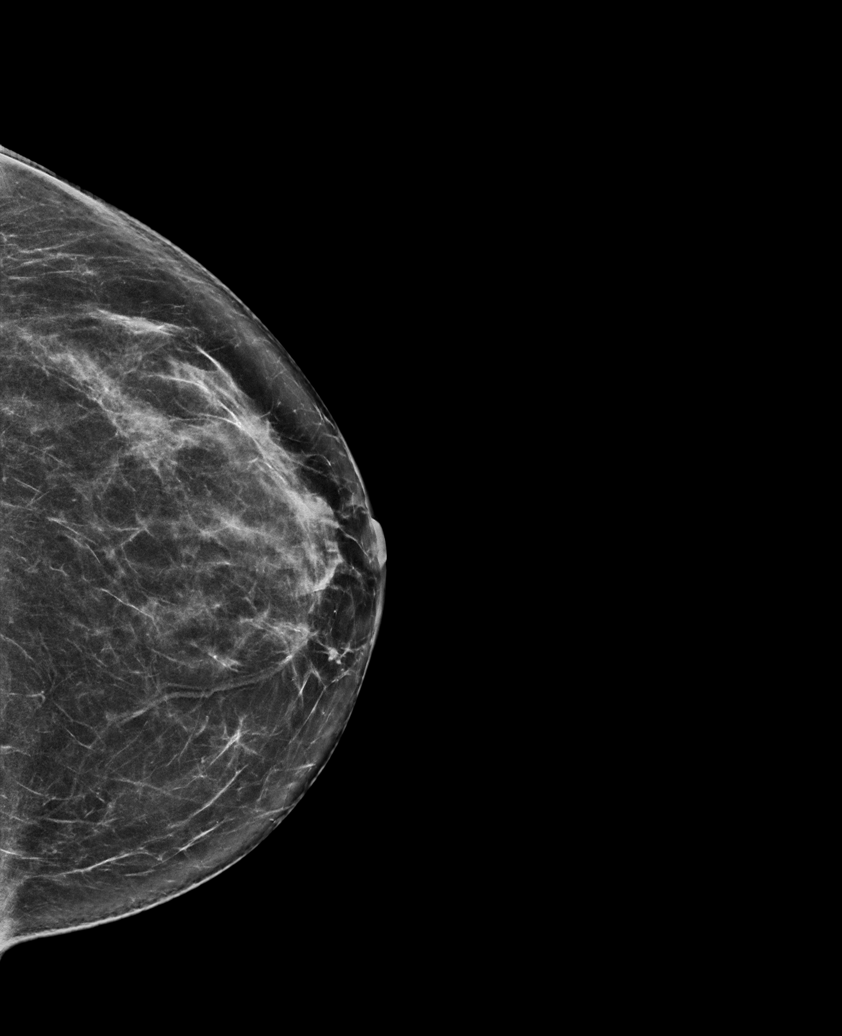

[R MLO synth-2D (2 of 2)]
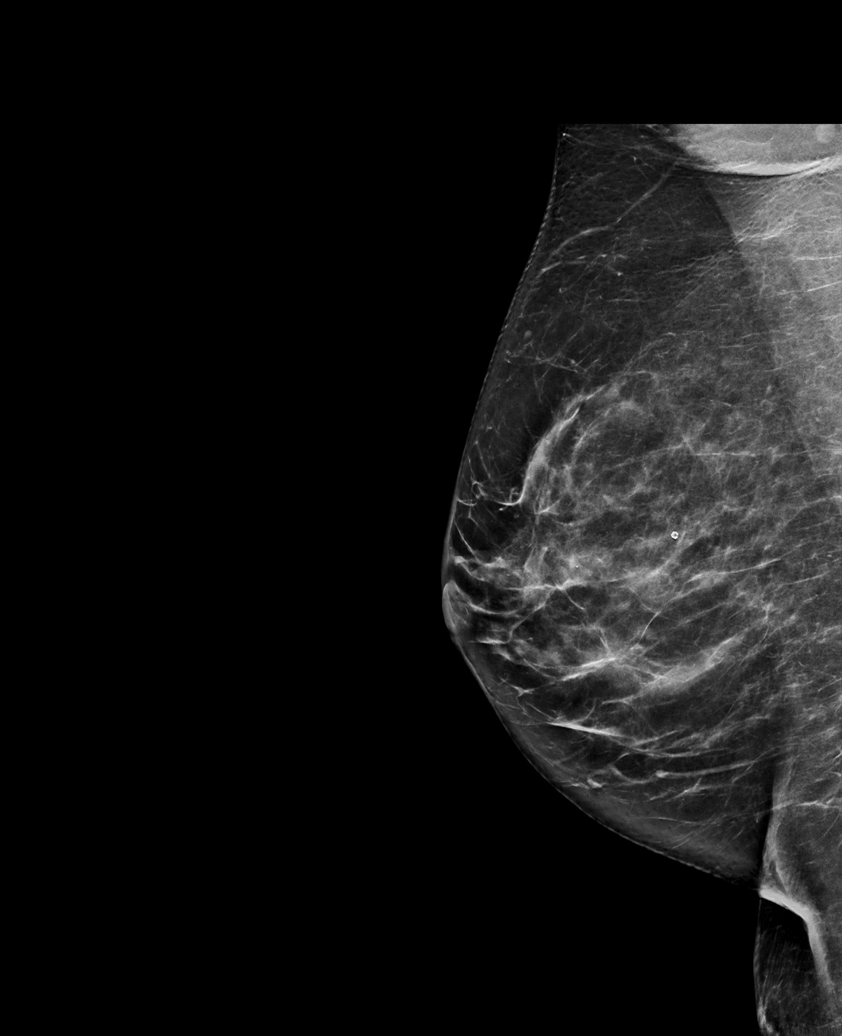

[L MLO synth-2D]
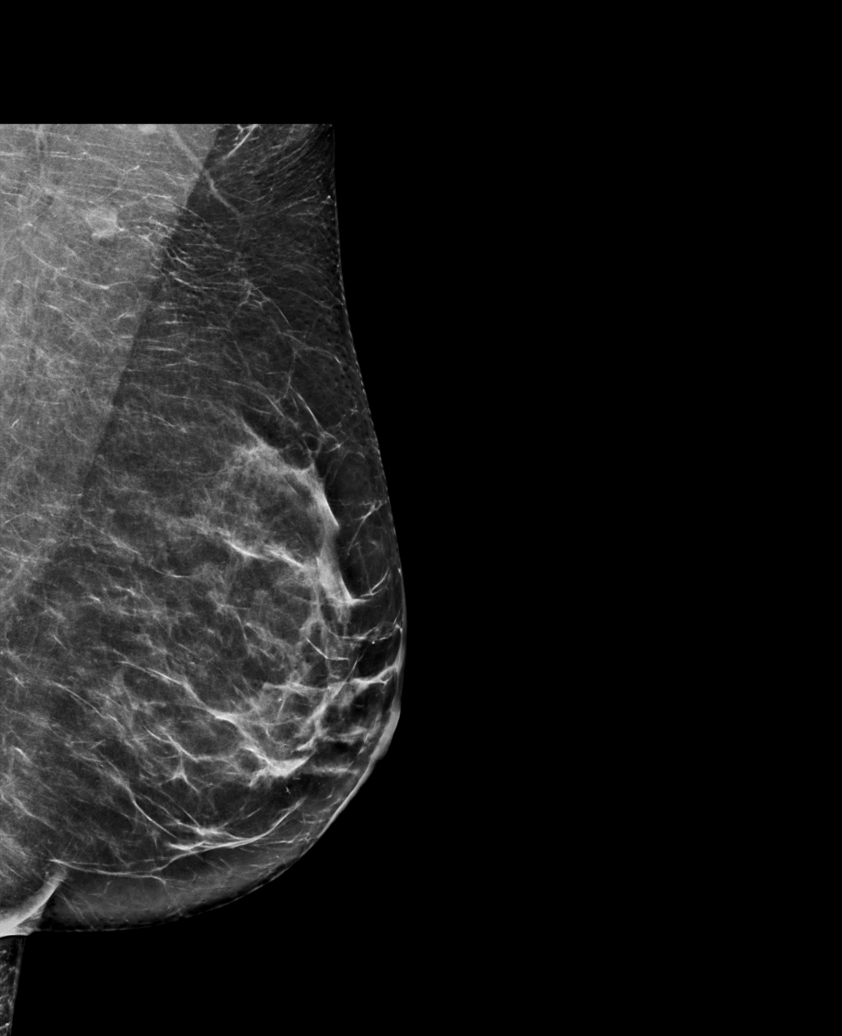

[R CC tomo · tomo slice 38/75.0]
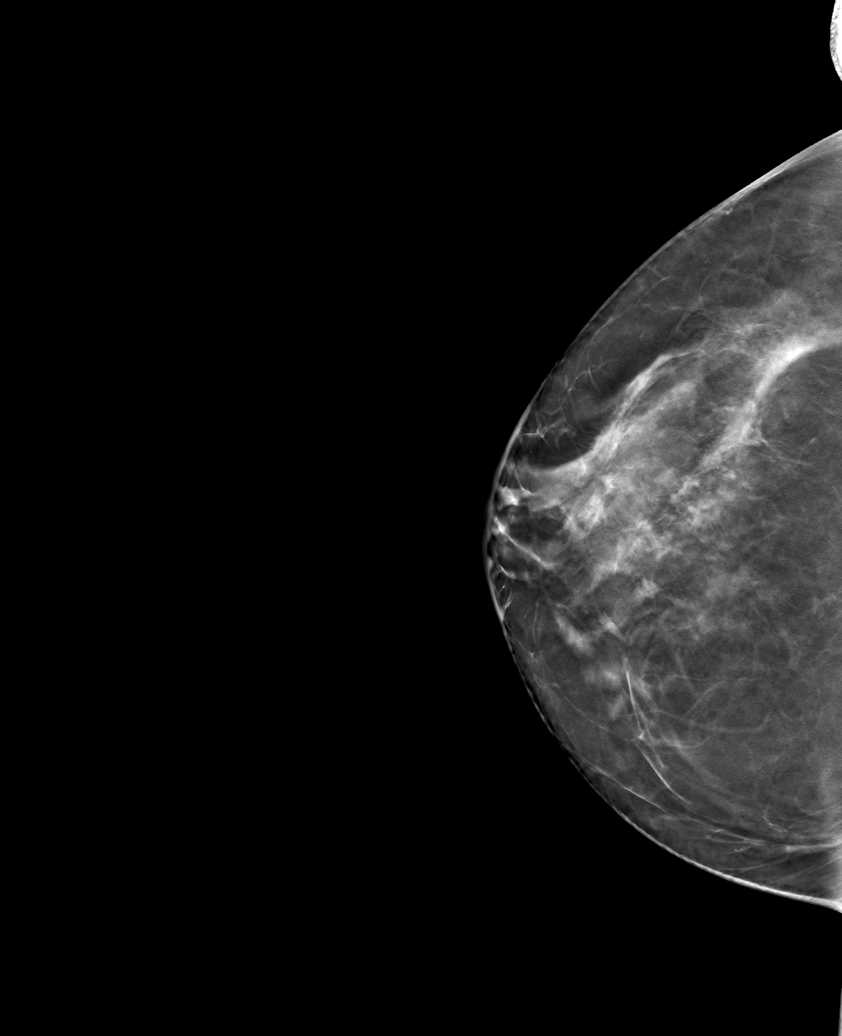

[6 of 30 positions shown; findings below may reference images not displayed]

ACR Breast Density Category c: The breast tissue is heterogeneously
dense, which may obscure small masses.
FINDINGS: There are no findings suspicious for malignancy.
IMPRESSION: No mammographic evidence of malignancy. A result letter of this
screening mammogram will be mailed directly to the patient.

RECOMMENDATION:
Screening mammogram in one year. (Code:[id number])

BI-RADS CATEGORY  1: Negative.

## 2023-11-01 DIAGNOSIS — M816 Localized osteoporosis [Lequesne]: Secondary | ICD-10-CM | POA: Diagnosis not present

## 2023-11-01 DIAGNOSIS — E7800 Pure hypercholesterolemia, unspecified: Secondary | ICD-10-CM | POA: Diagnosis not present

## 2023-12-02 DIAGNOSIS — M816 Localized osteoporosis [Lequesne]: Secondary | ICD-10-CM | POA: Diagnosis not present

## 2023-12-02 DIAGNOSIS — E7800 Pure hypercholesterolemia, unspecified: Secondary | ICD-10-CM | POA: Diagnosis not present

## 2024-01-01 DIAGNOSIS — E7800 Pure hypercholesterolemia, unspecified: Secondary | ICD-10-CM | POA: Diagnosis not present

## 2024-01-01 DIAGNOSIS — M816 Localized osteoporosis [Lequesne]: Secondary | ICD-10-CM | POA: Diagnosis not present

## 2024-02-23 ENCOUNTER — Other Ambulatory Visit (HOSPITAL_BASED_OUTPATIENT_CLINIC_OR_DEPARTMENT_OTHER): Payer: Self-pay | Admitting: Family Medicine

## 2024-02-23 DIAGNOSIS — M85851 Other specified disorders of bone density and structure, right thigh: Secondary | ICD-10-CM
